# Patient Record
Sex: Male | Born: 1945 | Race: White | Hispanic: No | Marital: Married | Smoking: Never smoker
Health system: Southern US, Community
[De-identification: ages and names within clinical notes are randomized; demographics above are authoritative.]

## PROBLEM LIST (undated history)

## (undated) DIAGNOSIS — M5412 Radiculopathy, cervical region: Secondary | ICD-10-CM

## (undated) DIAGNOSIS — I80201 Phlebitis and thrombophlebitis of unspecified deep vessels of right lower extremity: Secondary | ICD-10-CM

## (undated) DIAGNOSIS — E119 Type 2 diabetes mellitus without complications: Secondary | ICD-10-CM

## (undated) DIAGNOSIS — I10 Essential (primary) hypertension: Secondary | ICD-10-CM

## (undated) DIAGNOSIS — M1990 Unspecified osteoarthritis, unspecified site: Secondary | ICD-10-CM

## (undated) DIAGNOSIS — F419 Anxiety disorder, unspecified: Secondary | ICD-10-CM

## (undated) HISTORY — PX: HERNIA REPAIR: SHX51

## (undated) HISTORY — PX: WISDOM TOOTH EXTRACTION: SHX21

## (undated) HISTORY — PX: TONSILLECTOMY: SUR1361

## (undated) HISTORY — PX: TESTICULAR EXPLORATION: SHX5145

---

## 2009-11-14 ENCOUNTER — Ambulatory Visit: Payer: Self-pay | Admitting: Internal Medicine

## 2009-11-14 DIAGNOSIS — I10 Essential (primary) hypertension: Secondary | ICD-10-CM | POA: Insufficient documentation

## 2009-11-14 DIAGNOSIS — T7840XA Allergy, unspecified, initial encounter: Secondary | ICD-10-CM | POA: Insufficient documentation

## 2009-11-17 ENCOUNTER — Encounter: Payer: Self-pay | Admitting: Internal Medicine

## 2009-11-17 LAB — CONVERTED CEMR LAB
AST: 76 units/L — ABNORMAL HIGH (ref 0–37)
Albumin: 4.4 g/dL (ref 3.5–5.2)
Alkaline Phosphatase: 57 units/L (ref 39–117)
BUN: 13 mg/dL (ref 6–23)
Basophils Absolute: 0 10*3/uL (ref 0.0–0.1)
Basophils Relative: 0.6 % (ref 0.0–3.0)
Bilirubin, Direct: 0.3 mg/dL (ref 0.0–0.3)
Creatinine, Ser: 0.8 mg/dL (ref 0.4–1.5)
GFR calc non Af Amer: 105.7 mL/min (ref >60)
Glucose, Bld: 101 mg/dL — ABNORMAL HIGH (ref 70–99)
HCT: 47.3 % (ref 39.0–52.0)
MCHC: 34.9 g/dL (ref 30.0–36.0)
MCV: 100.6 fL — ABNORMAL HIGH (ref 78.0–100.0)
Monocytes Relative: 11 % (ref 3.0–12.0)
Neutro Abs: 4.4 10*3/uL (ref 1.4–7.7)
Platelets: 171 10*3/uL (ref 150.0–400.0)
Potassium: 3.4 meq/L — ABNORMAL LOW (ref 3.5–5.1)
Total Bilirubin: 1.4 mg/dL — ABNORMAL HIGH (ref 0.3–1.2)
Total Protein: 7.9 g/dL (ref 6.0–8.3)
WBC: 6.3 10*3/uL (ref 4.5–10.5)

## 2009-11-20 ENCOUNTER — Telehealth: Payer: Self-pay | Admitting: Internal Medicine

## 2009-11-27 ENCOUNTER — Telehealth (INDEPENDENT_AMBULATORY_CARE_PROVIDER_SITE_OTHER): Payer: Self-pay | Admitting: *Deleted

## 2010-07-28 NOTE — Assessment & Plan Note (Signed)
Summary: mold exposure/workers comp case/kcw   CC:  mold exposure and side pain.  History of Present Illness: Nov 14, 2009- 65 yoM nonsmoker seen for concern of possible environmental/ mold exposure after water leak at [employer]. He has worked there 22 years, currently as VP over Public librarian, in an office area where he describes black mold around duct work, water damage, other sick employees. They were moved to a different work area and renovation begun. He reports previous good health with no prior respiratory or allergic disease. He was doing well at time of routine physical exam at end of September, 2010 and through vacation in October. In November he noted eyes burning, sensation of lump in throat with some difficulty swallowing, left upper quadrant pain. In late April he "felt funny", confused, word-searching. He has felt better since moving out of the exposed area. He still notes a little soreness in the left upper quadrant- that discomfort was never explained. Still some irritation or congestion in nose .  He denies fever, rash, swollen glands, weight loss, cough or wheeze, purulence or bleeding, sinus infections or sore throats.. He has a remote hx of gout and says he is aware of some soreness in joints, but doesn't describe hot or acutely painful joints and indicates this is really at longe term baseline. He had taken no meds for these issues this winter.   Preventive Screening-Counseling & Management  Alcohol-Tobacco     Smoking Status: never  Current Medications (verified): 1)  Amlodipine Besylate 5 Mg Tabs (Amlodipine Besylate) .... Once Daily 2)  Losartan Potassium-Hctz 100-25 Mg Tabs (Losartan Potassium-Hctz) .... Once Daily  Allergies (verified): No Known Drug Allergies  Past History:  Past Medical History: Hypertension Gout  Past Surgical History: Tonsillectomy swollen testicle repair- benign 1968  Family History: father- emphysema  deceased age 42 mother- breast Ca deceased at 37  Social History: Patient never smoked.  ETOH 3-5 beers/ day Married, no children VP at [employer] Smoking Status:  never  Review of Systems      See HPI       The patient complains of abdominal pain, difficulty swallowing, sore throat, and joint stiffness or pain.  The patient denies shortness of breath with activity, shortness of breath at rest, productive cough, non-productive cough, coughing up blood, chest pain, irregular heartbeats, acid heartburn, indigestion, loss of appetite, weight change, tooth/dental problems, headaches, nasal congestion/difficulty breathing through nose, sneezing, itching, ear ache, anxiety, depression, hand/feet swelling, rash, change in color of mucus, and fever.    Vital Signs:  Patient profile:   65 year old male Height:      68 inches Weight:      237 pounds BMI:     36.17 O2 Sat:      97 % on Room air Pulse rate:   66 / minute BP sitting:   138 / 70  (left arm) Cuff size:   large  Vitals Entered By: Denna Haggard, CMA (Nov 14, 2009 12:00 PM)  O2 Sat at Rest %:  97% O2 Flow:  Room air  Physical Exam  Additional Exam:  General: A/Ox3; pleasant and cooperative, NAD, wdwn SKIN: no rash, lesions NODES: no lymphadenopathy HEENT: [place]/AT, EOM- WNL, Conjuctivae- injected, PERRLA, TM-WNL, Nose- clear, Throat- clear and wnl, Mallampati  III. Palate obscures posterior pharyngeal wall. Throat clearing. No stridor NECK: Supple w/ fair ROM, JVD- none, normal carotid impulses w/o bruits Thyroid- normal to palpation CHEST: Clear to P&A HEART: RRR,  no m/g/r heard ABDOMEN: Soft and nl; nml bowel sounds; no organomegaly or masses noted. Nontender to palpation in LUQ ZOX:WRUE, nl pulses, no edema. Hard prominence at elbows, suggestive of tophae or calcification in the olecranon bursae without crepitus or tenderness. NEURO: Grossly intact to observation      Impression &  Recommendations:  Problem # 1:  ALLERGY, ENVIRONMENTAL (ICD-995.3)  One of a group of coworkers being evaluated for symptoms with question of relation to mold exposure, water leak in workplace. He asks specifically about his liver because of regular beer drinking, but has been in good health previously. We will check basic labs and do IgE allergy profile with fungal assay. He has had some Left upper quadrant or left lower anterior chest pain, gradually improving. We can CXR with this in mind.   Medications Added to Medication List This Visit: 1)  Amlodipine Besylate 5 Mg Tabs (Amlodipine besylate) .... Once daily 2)  Losartan Potassium-hctz 100-25 Mg Tabs (Losartan potassium-hctz) .... Once daily  Other Orders: Consultation Level IV (99244) TLB-CBC Platelet - w/Differential (85025-CBCD) TLB-BMP (Basic Metabolic Panel-BMET) (80048-METABOL) TLB-Hepatic/Liver Function Pnl (80076-HEPATIC) T-Allergy Profile Region II-[place], [place], [place], [place], [place] [phone]) T-Fungal Panel Immuno ([id number]) T-2 View CXR (71020TC)  Patient Instructions: 1)  Please schedule a follow-up appointment as needed. 2)  Lab 3)  A chest x-ray has been recommended.  Your imaging study may require preauthorization.

## 2010-07-28 NOTE — Letter (Signed)
Summary: [facility]  [address]   [place], [place] [address]   Phone: [phone]  Fax: [phone]    11/17/2009  THERRON SELLS [address] [place], [place]  [address]  Dear Mr. GRATER,  We have tried to reach you by telephone regarding your recent test results, but were unfortunately unable to speak with you.  Please contact our office at [phone] to obtain these results. Thank You.         Sincerely,   Jetty Duhamel, MD

## 2010-07-28 NOTE — Progress Notes (Signed)
Summary: fax lab results to pt  Phone Note Call from Patient   Caller: Patient Call For: young Reason for Call: Talk to Doctor Summary of Call: would like to get his blood work results faxed to him.  Fax# [phone] Initial call taken by: Eugene Gavia,  November 27, 2009 10:12 AM  Follow-up for Phone Call        Faxed labs to pt.//Juanita Follow-up by: Darletta Moll,  November 27, 2009 1:16 PM

## 2010-07-28 NOTE — Progress Notes (Signed)
Summary: results  Phone Note Call from Patient   Caller: Patient Call For: wert Summary of Call: pt called re: results. [phone] Initial call taken by: Tivis Ringer, CNA,  Nov 20, 2009 10:33 AM  Follow-up for Phone Call        pt advised per appends of cxr and lab results. Number corrected in EMR and IDX.Joe Rivera CMA  Nov 20, 2009 10:42 AM

## 2011-05-03 ENCOUNTER — Encounter (HOSPITAL_COMMUNITY): Payer: Self-pay | Admitting: Pharmacy Technician

## 2011-05-04 ENCOUNTER — Other Ambulatory Visit: Payer: Self-pay

## 2011-05-04 ENCOUNTER — Other Ambulatory Visit (HOSPITAL_COMMUNITY): Payer: Worker's Compensation

## 2011-05-04 ENCOUNTER — Ambulatory Visit (HOSPITAL_COMMUNITY)
Admission: RE | Admit: 2011-05-04 | Discharge: 2011-05-04 | Disposition: A | Discharge status: Home / Self Care | Payer: Worker's Compensation | Source: Ambulatory Visit | Attending: Orthopedic Surgery | Admitting: Orthopedic Surgery

## 2011-05-04 ENCOUNTER — Encounter (HOSPITAL_COMMUNITY)
Admission: RE | Admit: 2011-05-04 | Discharge: 2011-05-04 | Disposition: A | Discharge status: Home / Self Care | Payer: Worker's Compensation | Source: Ambulatory Visit | Attending: Orthopedic Surgery | Admitting: Orthopedic Surgery

## 2011-05-04 ENCOUNTER — Encounter (HOSPITAL_COMMUNITY): Payer: Self-pay

## 2011-05-04 DIAGNOSIS — Z01818 Encounter for other preprocedural examination: Secondary | ICD-10-CM | POA: Insufficient documentation

## 2011-05-04 DIAGNOSIS — Z01810 Encounter for preprocedural cardiovascular examination: Secondary | ICD-10-CM | POA: Insufficient documentation

## 2011-05-04 DIAGNOSIS — Z01812 Encounter for preprocedural laboratory examination: Secondary | ICD-10-CM | POA: Insufficient documentation

## 2011-05-04 HISTORY — DX: Anxiety disorder, unspecified: F41.9

## 2011-05-04 HISTORY — DX: Radiculopathy, cervical region: M54.12

## 2011-05-04 HISTORY — DX: Phlebitis and thrombophlebitis of unspecified deep vessels of right lower extremity: I80.201

## 2011-05-04 HISTORY — DX: Essential (primary) hypertension: I10

## 2011-05-04 LAB — CBC
HCT: 44.7 % (ref 39.0–52.0)
Hemoglobin: 16.4 g/dL (ref 13.0–17.0)
RDW: 12 % (ref 11.5–15.5)
WBC: 6.2 10*3/uL (ref 4.0–10.5)

## 2011-05-04 LAB — URINALYSIS, ROUTINE W REFLEX MICROSCOPIC
Glucose, UA: NEGATIVE mg/dL
Leukocytes, UA: NEGATIVE
Specific Gravity, Urine: 1.007 (ref 1.005–1.030)
pH: 7.5 (ref 5.0–8.0)

## 2011-05-04 LAB — ABO/RH: ABO/RH(D): A POS

## 2011-05-04 LAB — DIFFERENTIAL
Basophils Relative: 1 % (ref 0–1)
Eosinophils Absolute: 0.4 10*3/uL (ref 0.0–0.7)
Eosinophils Relative: 6 % — ABNORMAL HIGH (ref 0–5)
Monocytes Absolute: 0.9 10*3/uL (ref 0.1–1.0)
Monocytes Relative: 14 % — ABNORMAL HIGH (ref 3–12)
Neutrophils Relative %: 61 % (ref 43–77)

## 2011-05-04 LAB — COMPREHENSIVE METABOLIC PANEL
Albumin: 3.9 g/dL (ref 3.5–5.2)
Alkaline Phosphatase: 64 U/L (ref 39–117)
BUN: 10 mg/dL (ref 6–23)
Chloride: 102 mEq/L (ref 96–112)
Potassium: 3.5 mEq/L (ref 3.5–5.1)
Total Bilirubin: 0.7 mg/dL (ref 0.3–1.2)

## 2011-05-04 LAB — SURGICAL PCR SCREEN: Staphylococcus aureus: POSITIVE — AB

## 2011-05-04 LAB — APTT: aPTT: 29 seconds (ref 24–37)

## 2011-05-04 NOTE — Pre-Procedure Instructions (Signed)
20 Joe Rivera  05/04/2011   Your procedure is scheduled on: Thursday May 06, 2011  Report to [facility] at 530 AM.  Call this number if you have problems the morning of surgery: [phone]   Remember:   Do not eat food:After Midnight.  Do not drink clear liquids: 4 Hours before arrival.(1:30am)  Take these medicines the morning of surgery with A SIP OF WATER:allopurinol, amlodipine, hypromellose    Do not wear jewelry, make-up or nail polish.  Do not wear lotions, powders, or perfumes. You may wear deodorant.  Do not shave 48 hours prior to surgery.  Do not bring valuables to the hospital.  Contacts, dentures or bridgework may not be worn into surgery.  Leave suitcase in the car. After surgery it may be brought to your room.  For patients admitted to the hospital, checkout time is 11:00 AM the day of discharge.   Patients discharged the day of surgery will not be allowed to drive home.  Name and phone number of your driver: Yoandri Congrove  [phone]  Special Instructions: CHG Shower Use Special Wash: 1/2 bottle night before surgery and 1/2 bottle morning of surgery.   Please read over the following fact sheets that you were given: Pain Booklet, Coughing and Deep Breathing, Blood Transfusion Information, MRSA Information and Surgical Site Infection Prevention , Incentive spirometry

## 2011-05-06 ENCOUNTER — Encounter (HOSPITAL_COMMUNITY)
Admission: RE | Disposition: A | Discharge status: Home / Self Care | Payer: Self-pay | Source: Ambulatory Visit | Attending: Orthopedic Surgery

## 2011-05-06 ENCOUNTER — Encounter (HOSPITAL_COMMUNITY): Payer: Self-pay | Admitting: Vascular Surgery

## 2011-05-06 ENCOUNTER — Inpatient Hospital Stay (HOSPITAL_COMMUNITY)
Admission: RE | Admit: 2011-05-06 | Discharge: 2011-05-07 | DRG: 473 | Disposition: A | Discharge status: Home / Self Care | Payer: Worker's Compensation | Source: Ambulatory Visit | Attending: Orthopedic Surgery | Admitting: Orthopedic Surgery

## 2011-05-06 ENCOUNTER — Encounter (HOSPITAL_COMMUNITY): Payer: Self-pay | Admitting: *Deleted

## 2011-05-06 ENCOUNTER — Inpatient Hospital Stay (HOSPITAL_COMMUNITY): Payer: Worker's Compensation

## 2011-05-06 ENCOUNTER — Inpatient Hospital Stay (HOSPITAL_COMMUNITY): Payer: Worker's Compensation | Admitting: Vascular Surgery

## 2011-05-06 DIAGNOSIS — M109 Gout, unspecified: Secondary | ICD-10-CM | POA: Diagnosis present

## 2011-05-06 DIAGNOSIS — I10 Essential (primary) hypertension: Secondary | ICD-10-CM

## 2011-05-06 DIAGNOSIS — Z23 Encounter for immunization: Secondary | ICD-10-CM

## 2011-05-06 DIAGNOSIS — Z86718 Personal history of other venous thrombosis and embolism: Secondary | ICD-10-CM

## 2011-05-06 DIAGNOSIS — M5030 Other cervical disc degeneration, unspecified cervical region: Principal | ICD-10-CM | POA: Diagnosis present

## 2011-05-06 DIAGNOSIS — F411 Generalized anxiety disorder: Secondary | ICD-10-CM | POA: Diagnosis present

## 2011-05-06 HISTORY — PX: ANTERIOR CERVICAL DECOMP/DISCECTOMY FUSION: SHX1161

## 2011-05-06 SURGERY — ANTERIOR CERVICAL DECOMPRESSION/DISCECTOMY FUSION 1 LEVEL
Anesthesia: General | Laterality: Right | Wound class: Clean

## 2011-05-06 MED ORDER — PHENOL 1.4 % MT LIQD: 1.0000 | OROMUCOSAL | Status: DC | PRN

## 2011-05-06 MED ORDER — AMLODIPINE BESYLATE 10 MG PO TABS
10.0000 mg | ORAL_TABLET | Freq: Every day | ORAL | Status: DC
  Administered 2011-05-06: 10 mg via ORAL
  Filled 2011-05-06 (×2): qty 1

## 2011-05-06 MED ORDER — THROMBIN 20000 UNITS EX KIT
PACK | CUTANEOUS | Status: DC | PRN
  Administered 2011-05-06: 20000 [IU] via TOPICAL

## 2011-05-06 MED ORDER — SENNA 8.6 MG PO TABS
1.0000 | ORAL_TABLET | Freq: Two times a day (BID) | ORAL | Status: DC
  Administered 2011-05-07: 8.6 mg via ORAL
  Filled 2011-05-06 (×3): qty 1

## 2011-05-06 MED ORDER — HYDROMORPHONE HCL PF 1 MG/ML IJ SOLN: 0.2500 mg | INTRAMUSCULAR | Status: DC | PRN

## 2011-05-06 MED ORDER — MIDAZOLAM HCL 2 MG/2ML IJ SOLN: 0.5000 mg | Freq: Once | INTRAMUSCULAR | Status: DC | PRN

## 2011-05-06 MED ORDER — MORPHINE SULFATE 2 MG/ML IJ SOLN: 0.0500 mg/kg | INTRAMUSCULAR | Status: DC | PRN

## 2011-05-06 MED ORDER — LACTATED RINGERS IV SOLN: INTRAVENOUS | Status: DC

## 2011-05-06 MED ORDER — DIAZEPAM 5 MG PO TABS
5.0000 mg | ORAL_TABLET | Freq: Four times a day (QID) | ORAL | Status: DC | PRN
  Administered 2011-05-06 – 2011-05-07 (×2): 5 mg via ORAL
  Filled 2011-05-06 (×2): qty 1

## 2011-05-06 MED ORDER — SODIUM CHLORIDE 0.9 % IJ SOLN: 3.0000 mL | Freq: Two times a day (BID) | INTRAMUSCULAR | Status: DC

## 2011-05-06 MED ORDER — ALLOPURINOL 300 MG PO TABS
300.0000 mg | ORAL_TABLET | Freq: Every day | ORAL | Status: DC | PRN
  Filled 2011-05-06: qty 1

## 2011-05-06 MED ORDER — MORPHINE SULFATE 4 MG/ML IJ SOLN
2.0000 mg | INTRAMUSCULAR | Status: DC | PRN
  Administered 2011-05-06: 2 mg via INTRAVENOUS
  Filled 2011-05-06: qty 1

## 2011-05-06 MED ORDER — FENTANYL CITRATE 0.05 MG/ML IJ SOLN: 50.0000 ug | INTRAMUSCULAR | Status: DC | PRN

## 2011-05-06 MED ORDER — INFLUENZA VIRUS VACC SPLIT PF IM SUSP
0.5000 mL | INTRAMUSCULAR | Status: DC
Start: 2011-05-07 — End: 2011-05-07
  Filled 2011-05-06: qty 0.5

## 2011-05-06 MED ORDER — SODIUM CHLORIDE 0.9 % IJ SOLN
3.0000 mL | INTRAMUSCULAR | Status: DC | PRN
  Administered 2011-05-06: 3 mL via INTRAVENOUS

## 2011-05-06 MED ORDER — HYDROCHLOROTHIAZIDE 25 MG PO TABS
25.0000 mg | ORAL_TABLET | Freq: Every day | ORAL | Status: DC
  Administered 2011-05-06: 25 mg via ORAL
  Filled 2011-05-06 (×2): qty 1

## 2011-05-06 MED ORDER — HYDROMORPHONE HCL PF 1 MG/ML IJ SOLN
INTRAMUSCULAR | Status: AC
  Filled 2011-05-06: qty 1

## 2011-05-06 MED ORDER — HYPROMELLOSE 0.3 % OP SOLN: 1.0000 [drp] | OPHTHALMIC | Status: DC

## 2011-05-06 MED ORDER — PROMETHAZINE HCL 25 MG/ML IJ SOLN: 6.2500 mg | INTRAMUSCULAR | Status: DC | PRN

## 2011-05-06 MED ORDER — ALUM & MAG HYDROXIDE-SIMETH 400-400-40 MG/5ML PO SUSP
30.0000 mL | Freq: Four times a day (QID) | ORAL | Status: DC | PRN
  Filled 2011-05-06: qty 30

## 2011-05-06 MED ORDER — MUPIROCIN 2 % EX OINT
TOPICAL_OINTMENT | Freq: Two times a day (BID) | CUTANEOUS | Status: DC
  Filled 2011-05-06: qty 22

## 2011-05-06 MED ORDER — MEPERIDINE HCL 25 MG/ML IJ SOLN: 6.2500 mg | INTRAMUSCULAR | Status: DC | PRN

## 2011-05-06 MED ORDER — NEOSTIGMINE METHYLSULFATE 1 MG/ML IJ SOLN
INTRAMUSCULAR | Status: DC | PRN
  Administered 2011-05-06: 5 mg via INTRAVENOUS

## 2011-05-06 MED ORDER — ZOLPIDEM TARTRATE 5 MG PO TABS
5.0000 mg | ORAL_TABLET | Freq: Every evening | ORAL | Status: DC | PRN
  Administered 2011-05-06: 5 mg via ORAL
  Filled 2011-05-06: qty 1

## 2011-05-06 MED ORDER — LOSARTAN POTASSIUM 50 MG PO TABS
100.0000 mg | ORAL_TABLET | Freq: Every day | ORAL | Status: DC
  Administered 2011-05-06: 100 mg via ORAL
  Filled 2011-05-06 (×2): qty 2

## 2011-05-06 MED ORDER — CEFAZOLIN SODIUM 1-5 GM-% IV SOLN
INTRAVENOUS | Status: DC | PRN
  Administered 2011-05-06: 2 g via INTRAVENOUS

## 2011-05-06 MED ORDER — PROPOFOL 10 MG/ML IV EMUL
INTRAVENOUS | Status: DC | PRN
  Administered 2011-05-06: 200 mg via INTRAVENOUS

## 2011-05-06 MED ORDER — BUPIVACAINE-EPINEPHRINE 0.25% -1:200000 IJ SOLN
INTRAMUSCULAR | Status: DC | PRN
  Administered 2011-05-06: 2 mL

## 2011-05-06 MED ORDER — DOCUSATE SODIUM 100 MG PO CAPS
100.0000 mg | ORAL_CAPSULE | Freq: Two times a day (BID) | ORAL | Status: DC
  Administered 2011-05-06: 100 mg via ORAL
  Filled 2011-05-06 (×2): qty 1

## 2011-05-06 MED ORDER — POTASSIUM CHLORIDE IN NACL 20-0.9 MEQ/L-% IV SOLN
INTRAVENOUS | Status: DC
  Administered 2011-05-06: 19:00:00 via INTRAVENOUS
  Filled 2011-05-06 (×4): qty 1000

## 2011-05-06 MED ORDER — POLYVINYL ALCOHOL 1.4 % OP SOLN
1.0000 [drp] | OPHTHALMIC | Status: DC | PRN
  Filled 2011-05-06: qty 15

## 2011-05-06 MED ORDER — SODIUM CHLORIDE 0.9 % IV SOLN: 250.0000 mL | INTRAVENOUS | Status: DC

## 2011-05-06 MED ORDER — CEFAZOLIN SODIUM 1-5 GM-% IV SOLN
1.0000 g | Freq: Three times a day (TID) | INTRAVENOUS | Status: AC
  Administered 2011-05-06 (×2): 1 g via INTRAVENOUS
  Filled 2011-05-06 (×2): qty 50

## 2011-05-06 MED ORDER — GLYCOPYRROLATE 0.2 MG/ML IJ SOLN
INTRAMUSCULAR | Status: DC | PRN
  Administered 2011-05-06: .6 mg via INTRAVENOUS

## 2011-05-06 MED ORDER — ACETAMINOPHEN 325 MG PO TABS: 650.0000 mg | ORAL_TABLET | ORAL | Status: DC | PRN

## 2011-05-06 MED ORDER — MIDAZOLAM HCL 5 MG/5ML IJ SOLN
INTRAMUSCULAR | Status: DC | PRN
  Administered 2011-05-06: 2 mg via INTRAVENOUS

## 2011-05-06 MED ORDER — FENTANYL CITRATE 0.05 MG/ML IJ SOLN
INTRAMUSCULAR | Status: DC | PRN
  Administered 2011-05-06 (×4): 50 ug via INTRAVENOUS
  Administered 2011-05-06: 150 ug via INTRAVENOUS

## 2011-05-06 MED ORDER — ACETAMINOPHEN 650 MG RE SUPP: 650.0000 mg | RECTAL | Status: DC | PRN

## 2011-05-06 MED ORDER — MAGNESIUM HYDROXIDE 400 MG/5ML PO SUSP: 30.0000 mL | Freq: Two times a day (BID) | ORAL | Status: DC | PRN

## 2011-05-06 MED ORDER — LACTATED RINGERS IV SOLN
INTRAVENOUS | Status: DC | PRN
  Administered 2011-05-06 (×4): via INTRAVENOUS

## 2011-05-06 MED ORDER — OXYCODONE-ACETAMINOPHEN 5-325 MG PO TABS
1.0000 | ORAL_TABLET | ORAL | Status: DC | PRN
  Administered 2011-05-06 – 2011-05-07 (×2): 2 via ORAL
  Filled 2011-05-06 (×2): qty 2

## 2011-05-06 MED ORDER — HYDROMORPHONE HCL PF 1 MG/ML IJ SOLN
0.2500 mg | INTRAMUSCULAR | Status: DC | PRN
  Administered 2011-05-06 (×3): 0.5 mg via INTRAVENOUS

## 2011-05-06 MED ORDER — SUCCINYLCHOLINE CHLORIDE 20 MG/ML IJ SOLN
INTRAMUSCULAR | Status: DC | PRN
  Administered 2011-05-06: 120 mg via INTRAVENOUS

## 2011-05-06 MED ORDER — MIDAZOLAM HCL 2 MG/2ML IJ SOLN: 1.0000 mg | INTRAMUSCULAR | Status: DC | PRN

## 2011-05-06 MED ORDER — MENTHOL 3 MG MT LOZG
1.0000 | LOZENGE | OROMUCOSAL | Status: DC | PRN
  Filled 2011-05-06: qty 9

## 2011-05-06 MED ORDER — ONDANSETRON HCL 4 MG/2ML IJ SOLN
INTRAMUSCULAR | Status: DC | PRN
  Administered 2011-05-06: 4 mg via INTRAVENOUS

## 2011-05-06 MED ORDER — HYDROXYZINE HCL 25 MG PO TABS: 50.0000 mg | ORAL_TABLET | ORAL | Status: DC | PRN

## 2011-05-06 MED ORDER — HYDROXYZINE HCL 50 MG/ML IM SOLN: 50.0000 mg | INTRAMUSCULAR | Status: DC | PRN

## 2011-05-06 MED ORDER — ROCURONIUM BROMIDE 100 MG/10ML IV SOLN
INTRAVENOUS | Status: DC | PRN
  Administered 2011-05-06: 30 mg via INTRAVENOUS
  Administered 2011-05-06 (×2): 20 mg via INTRAVENOUS

## 2011-05-06 MED ORDER — LOSARTAN POTASSIUM-HCTZ 100-25 MG PO TABS: 1.0000 | ORAL_TABLET | Freq: Every day | ORAL | Status: DC

## 2011-05-06 SURGICAL SUPPLY — 73 items
3.0X3.8MM NEURO DRILL, LESS AGGRESSIVE ×2 IMPLANT
5mm cervical 0 deg endoskeleton  small (Cage) ×2 IMPLANT
5mm cervical 0 deg endoskeleton medium (Cage) ×4 IMPLANT
BENZOIN TINCTURE PRP APPL 2/3 (GAUZE/BANDAGES/DRESSINGS) ×2 IMPLANT
BIT DRILL NEURO 2X3.1 SFT TUCH (MISCELLANEOUS) ×1 IMPLANT
BLADE LONG MED 31X9 (MISCELLANEOUS) IMPLANT
BLADE SURG 15 STRL LF DISP TIS (BLADE) ×1 IMPLANT
BLADE SURG 15 STRL SS (BLADE) ×1
BLADE SURG ROTATE 9660 (MISCELLANEOUS) ×2 IMPLANT
BUR NEURO DRILL SOFT 3.0X3.8M (BURR) ×2 IMPLANT
CARTRIDGE OIL MAESTRO DRILL (MISCELLANEOUS) ×1 IMPLANT
CLOTH BEACON ORANGE TIMEOUT ST (SAFETY) ×2 IMPLANT
COLLAR CERV LO CONTOUR FIRM DE (SOFTGOODS) IMPLANT
CORDS BIPOLAR (ELECTRODE) ×2 IMPLANT
COVER SURGICAL LIGHT HANDLE (MISCELLANEOUS) ×2 IMPLANT
CRADLE DONUT ADULT HEAD (MISCELLANEOUS) ×2 IMPLANT
DIFFUSER DRILL AIR PNEUMATIC (MISCELLANEOUS) ×2 IMPLANT
DISTRACTION PIN 14MM (INSTRUMENTS) IMPLANT
DRAIN JACKSON RD 7FR 3/32 (WOUND CARE) ×2 IMPLANT
DRAPE C-ARM 42X72 X-RAY (DRAPES) ×2 IMPLANT
DRAPE POUCH INSTRU U-SHP 10X18 (DRAPES) ×2 IMPLANT
DRAPE SURG 17X23 STRL (DRAPES) ×6 IMPLANT
DRILL NEURO 2X3.1 SOFT TOUCH (MISCELLANEOUS) ×2
DURAPREP 6ML APPLICATOR 50/CS (WOUND CARE) ×2 IMPLANT
ELECT COATED BLADE 2.86 ST (ELECTRODE) ×2 IMPLANT
ELECT REM PT RETURN 9FT ADLT (ELECTROSURGICAL) ×2
ELECTRODE REM PT RTRN 9FT ADLT (ELECTROSURGICAL) ×1 IMPLANT
EVACUATOR SILICONE 100CC (DRAIN) ×2 IMPLANT
GAUZE SPONGE 4X4 16PLY XRAY LF (GAUZE/BANDAGES/DRESSINGS) ×2 IMPLANT
GLOVE BIO SURGEON STRL SZ8 (GLOVE) ×4 IMPLANT
GLOVE BIOGEL PI IND STRL 8 (GLOVE) ×1 IMPLANT
GLOVE BIOGEL PI INDICATOR 8 (GLOVE) ×1
GOWN PREVENTION PLUS XLARGE (GOWN DISPOSABLE) ×4 IMPLANT
GOWN STRL NON-REIN LRG LVL3 (GOWN DISPOSABLE) ×2 IMPLANT
IV CATH 14GX2 1/4 (CATHETERS) ×2 IMPLANT
KIT BASIN OR (CUSTOM PROCEDURE TRAY) ×2 IMPLANT
KIT ROOM TURNOVER OR (KITS) ×2 IMPLANT
MANIFOLD NEPTUNE II (INSTRUMENTS) ×2 IMPLANT
NEEDLE 27GAX1X1/2 (NEEDLE) ×2 IMPLANT
NEEDLE SPNL 20GX3.5 QUINCKE YW (NEEDLE) ×2 IMPLANT
NS IRRIG 1000ML POUR BTL (IV SOLUTION) ×2 IMPLANT
OIL CARTRIDGE MAESTRO DRILL (MISCELLANEOUS) ×2
PACK ORTHO CERVICAL (CUSTOM PROCEDURE TRAY) ×2 IMPLANT
PAD ARMBOARD 7.5X6 YLW CONV (MISCELLANEOUS) ×2 IMPLANT
PATTIES SURGICAL .5 X.5 (GAUZE/BANDAGES/DRESSINGS) IMPLANT
PATTIES SURGICAL .5 X1 (DISPOSABLE) IMPLANT
PIN RETAINER PRODISC 14 MM (PIN) ×4 IMPLANT
PLATE VECTRA 45MM (Plate) ×2 IMPLANT
PUTTY BONE DBX 5CC MIX (Putty) ×2 IMPLANT
RETAINER SCREWS ×4 IMPLANT
SCREW 4.0X16MM (Screw) ×16 IMPLANT
SPONGE GAUZE 4X4 12PLY (GAUZE/BANDAGES/DRESSINGS) ×2 IMPLANT
SPONGE GAUZE 4X4 STERILE 39 (GAUZE/BANDAGES/DRESSINGS) ×2 IMPLANT
SPONGE INTESTINAL PEANUT (DISPOSABLE) ×6 IMPLANT
SPONGE SURGIFOAM ABS GEL 100 (HEMOSTASIS) IMPLANT
STERI STRIP BROWN 1/4X3 R155 (GAUZE/BANDAGES/DRESSINGS) ×2 IMPLANT
STRIP CLOSURE SKIN 1/2X4 (GAUZE/BANDAGES/DRESSINGS) ×2 IMPLANT
SURGIFLO TRUKIT (HEMOSTASIS) IMPLANT
SUT MNCRL AB 4-0 PS2 18 (SUTURE) IMPLANT
SUT SILK 4 0 (SUTURE)
SUT SILK 4-0 18XBRD TIE 12 (SUTURE) IMPLANT
SUT VIC AB 1 CT1 27 (SUTURE) ×1
SUT VIC AB 1 CT1 27XBRD ANBCTR (SUTURE) ×1 IMPLANT
SUT VIC AB 2-0 CT2 18 VCP726D (SUTURE) ×2 IMPLANT
SYR BULB IRRIGATION 50ML (SYRINGE) ×2 IMPLANT
SYR CONTROL 10ML LL (SYRINGE) ×2 IMPLANT
TAPE CLOTH 4X10 WHT NS (GAUZE/BANDAGES/DRESSINGS) ×2 IMPLANT
TAPE CLOTH SURG 6X10 WHT LF (GAUZE/BANDAGES/DRESSINGS) ×2 IMPLANT
TAPE UMBILICAL COTTON 1/8X30 (MISCELLANEOUS) ×4 IMPLANT
TOWEL OR 17X24 6PK STRL BLUE (TOWEL DISPOSABLE) ×2 IMPLANT
TOWEL OR 17X26 10 PK STRL BLUE (TOWEL DISPOSABLE) ×2 IMPLANT
WATER STERILE IRR 1000ML POUR (IV SOLUTION) ×2 IMPLANT
YANKAUER SUCT BULB TIP NO VENT (SUCTIONS) ×2 IMPLANT

## 2011-05-06 NOTE — Anesthesia Procedure Notes (Addendum)
Performed by: Cecilie Kicks    Procedure Name: Intubation Date/Time: 05/06/2011 7:45 AM Performed by: Cecilie Kicks Pre-anesthesia Checklist: Timeout performed, Patient identified, Emergency Drugs available, Suction available and Patient being monitored Patient Re-evaluated:Patient Re-evaluated prior to inductionOxygen Delivery Method: Simple face mask Intubation Type: IV induction Ventilation: Mask ventilation without difficulty Laryngoscope Size: 3 and Miller Grade View: Grade II Tube type: Oral Tube size: 7.5 mm Airway Equipment and Method: oral airway Placement Confirmation: ETT inserted through vocal cords under direct vision,  positive ETCO2 and breath sounds checked- equal and bilateral Secured at: 21 cm Tube secured with: Tape Dental Injury: Teeth and Oropharynx as per pre-operative assessment     Performed by: Cecilie Kicks

## 2011-05-06 NOTE — Anesthesia Postprocedure Evaluation (Signed)
  Anesthesia Post-op Note  Patient: Joe Rivera  Procedure(s) Performed:  ANTERIOR CERVICAL DECOMPRESSION/DISCECTOMY FUSION 1 LEVEL - ANTERIOR CERVICAL DECOMPRESSION AN FUSION C4-7 WITH INSTRUMENTATION RIGHT  Patient Location: PACU  Anesthesia Type: General  Level of Consciousness: awake  Airway and Oxygen Therapy: Patient Spontanous Breathing  Post-op Pain: mild  Post-op Assessment: Post-op Vital signs reviewed  Post-op Vital Signs: stable  Complications: No apparent anesthesia complications

## 2011-05-06 NOTE — Transfer of Care (Signed)
Immediate Anesthesia Transfer of Care Note  Patient: Joe Rivera  Procedure(s) Performed:  ANTERIOR CERVICAL DECOMPRESSION/DISCECTOMY FUSION 1 LEVEL - ANTERIOR CERVICAL DECOMPRESSION AN FUSION C4-7 WITH INSTRUMENTATION RIGHT  Patient Location: PACU  Anesthesia Type: General  Level of Consciousness: awake, alert  and oriented  Airway & Oxygen Therapy: Patient connected to face mask oxygen  Post-op Assessment: Report given to PACU RN and Post -op Vital signs reviewed and stable  Post vital signs: Reviewed and stable  Complications: No apparent anesthesia complications

## 2011-05-06 NOTE — Op Note (Signed)
NAMEALLIE, Joe Rivera              ACCOUNT NO.:  [id number]  MEDICAL RECORD NO.:  [id number]  LOCATION:  [facility]                         FACILITY:  [facility]  PHYSICIAN:  Estill Bamberg, MD      DATE OF BIRTH:  [birth year]  DATE OF PROCEDURE:  05/06/2011 DATE OF DISCHARGE:                              OPERATIVE REPORT   PREOPERATIVE DIAGNOSIS: 1. Right-sided cervical radiculopathy. 2. Severe degenerative disk disease, C4-5, C5-6, C6-7.  POSTOPERATIVE DIAGNOSES: 1. Right-sided cervical radiculopathy. 2. Severe degenerative disk disease, C4-5, C5-6, C6-7.  PROCEDURE: 1. Anterior cervical decompression and fusion C4-5, C5-6, C6-7. 2. Placement of anterior instrumentation, C4-C7. 3. Placement of Titan interbody cage x3 (5 mm parallel Titan cages     x3). 4. Use of local autograft. 5. Use of morselized allograft. 6. Intraoperative use of fluoroscopy.  SURGEON:  Estill Bamberg, MD  ASSISTANT:  Skip Mayer, PA-C  ANESTHESIA:  General endotracheal anesthesia.  COMPLICATIONS:  None.  DISPOSITION:  Stable.  ESTIMATED BLOOD LOSS:  Minimal.  INDICATIONS FOR PROCEDURE:  Briefly, Joe Rivera is an extremely pleasant 65 year old male who was injured in July and went on to have severe pain and weakness in the right arm.  The patient was evaluated by me on April 05, 2011.  I did review an MRI, which was notable for severe degenerative disk disease with severe foraminal stenosis at the C4-5, C5-6, and C6-7 levels.  I did feel that the patient's symptomatology did correlate to the MRI findings.  Of note, the patient did have physical therapy as well as anti-inflammatory medications and did continue to have severe pain in addition to weakness.  Given his failure of conservative care and ongoing and severe pain, we did have a discussion regarding going forward with a three-level anterior cervical decompression and fusion spanning C4-C7 for the reasons noted above. The patient fully  understood the risks and limitations of the procedure as outlined in my preoperative note.  OPERATIVE DETAILS:  On May 06, 2011, the patient was brought to Surgery and general endotracheal anesthesia was administered.  2 g of Ancef were given for antibiotic prophylaxis.  SCDs were placed.  Time- out procedure was performed.  I then brought in lateral fluoroscopy and did locate the approximate area of the C5-6 intervertebral level.  The neck was then prepped and draped in the usual sterile fashion.  I then made a transverse incision from the midline to the medial border of the sternocleidomastoid muscle.  The plane between the sternocleidomastoid muscle laterally and strap muscles medially was readily identified and explored.  The anterior cervical spine was readily noted.  I then placed a 20-gauge spinal needle into the C4-5 intervertebral space and a lateral fluoroscopic view did confirm this to be the appropriate level. I then subperiosteally exposed the vertebral bodies of C4, C5, C6, and C7 out to the uncovertebral joints.  Of note, there were extensive osteophytes noted mainly involving the C4-5 and C5-6 levels into lesser extent the C6-7 level.  These osteophytes were removed and autograft was placed in the back table for later use.  I then placed a Shadow Line retractor centered over the C6-7 intervertebral space.  I then used a  15- blade knife and went forward with the preliminary diskectomy.  Of note, there were severe and significant osteophytes, and it was extremely difficult to gain access into the intervertebral space.  I then placed distraction pins in the C6 and C7 vertebral bodies.  I then meticulously used a series of curettes and Kerrison punches to perform a thorough diskectomy to the posterior longitudinal ligament.  I did again access through the posterior longitudinal ligament and #1 Kerrison was used to remove posterior osteophytes and a thorough right-sided  neural foraminal decompression was performed.  I was able to easily sweep a nerve hook out the C6-7 intervertebral space, which did confirm adequate decompression of the exiting C7 nerve.  I then gently prepared the C6 and C7 endplates using a bur in addition to her rasp.  I then went forward placing a series of trials.  I did feel that a medium 5-mm interbody implant would be the most appropriate fit.  Allograft obtained from musculoskeletal transplant foundation was selected and packed into the interbody device.  The osteophytes that were removed using autograft were also packed into the interbody device.  The interbody device was tamped into position in the usual fashion and I did note an excellent press fit.  The C7 Caspar pin was removed and the bone wax was placed. I then turned my attention towards the C5-6 interspace.  Again, there was extensive osteophytes identified and there was severe height loss noted at the C5-6 level.  I did, however, meticulously go forward with a diskectomy to the posterior longitudinal ligament.  I then gained access to the posterior longitudinal ligament, and we again went forward with a thorough neural foraminal decompression on the right side at the C5-6 level.  Of note, this level was extremely tight and the exiting C6 nerve was noted to be extremely stenotic with extensive and abundant bone spurs noted in the neuroforaminal area.  I did however safely and adequately remove the disk osteophyte complex and I was able to easily sweep the nerve hook out the neural foramen on the right side, which did confirm adequate decompression of the nerve.  I then again prepared the endplates and tamped a 5-mm parallel Titan medium cage into position. The cage was packed with autograft and allograft as noted previously. Again noted an excellent press fit.  I then turned my attention towards the C4-5 interspace.  A diskectomy was performed in the manner  described previously.  A thorough foraminal decompression was performed on the right side.  I again was able to easily sweep a nerve hook out the neural foramen on the right side at the termination of the procedure. Again, the endplates were prepared.  I then selected a small medium parallel Titan cage and this was packed with allograft and autograft and tamped into position in the manner described previously.  I then chose an appropriately sized Synthes Vector plate.  This was placed over the anterior cervical spine.  A total of 16 mm self-drilling, self-tapping, variable angle screws were placed.  Two screws were placed into each of the vertebral bodies of C4, C5, C6, and C7.  I did note excellent purchase of each of the screws.  AP and lateral fluoroscopy views were used while applying the plate to confirm adequate positioning of the plate.  I did ensure that the plate was as low as possible so as not to accelerate adjacent segment disease at the level above.  I was very happy with the final  construct and the thorough decompression described above.  At this point, the wound was copiously irrigated.  The deep subcutaneous layer was closed using 2-0 Vicryl and the skin was closed using 3-0 Monocryl.  All instrument counts were correct at the termination of the procedure.  Of note, Skip Mayer was my assistant throughout the procedure and aided in essential retraction and suctioning needed throughout the surgery.     Estill Bamberg, MD     MD/MEDQ  D:  05/06/2011  T:  05/06/2011  Job:  [id number]  cc:   Crist Fat, MD

## 2011-05-06 NOTE — H&P (Signed)
PREOPERATIVE H&P  Chief Complaint: right arm pain  HPI: Joe Rivera is a 65 y.o. male who presents for right arm pain x 4 months  Past Medical History  Diagnosis Date  . Hypertension   . Gout     history of, takes allopurinol as needed  . Cervical radicular pain     has pain in right arm  . Anxiety     does not take medication for  . Deep vein thrombophlebitis of right leg     1991 or 1992   Past Surgical History  Procedure Date  . Tonsillectomy     age 75 years old  . Testicular exploration    History   Social History  . Marital Status: Married    Spouse Name: N/A    Number of Children: N/A  . Years of Education: N/A   Social History Main Topics  . Smoking status: Never Smoker   . Smokeless tobacco: None  . Alcohol Use: 4.2 oz/week    7 Cans of beer per week  . Drug Use: No  . Sexually Active: Yes   Other Topics Concern  . None   Social History Narrative  . None   History reviewed. No pertinent family history. No Known Allergies Prior to Admission medications   Medication Sig Start Date End Date Taking? Authorizing Provider  amLODipine (NORVASC) 10 MG tablet Take 10 mg by mouth daily.     Yes Historical Provider, MD  Hypromellose (GENTEAL) 0.3 % SOLN Place 1 drop into both eyes every morning.     Yes Historical Provider, MD  losartan-hydrochlorothiazide (HYZAAR) 100-25 MG per tablet Take 1 tablet by mouth daily.     Yes Historical Provider, MD  allopurinol (ZYLOPRIM) 300 MG tablet Take 300 mg by mouth daily as needed. For gout      Historical Provider, MD  naproxen (NAPROSYN) 500 MG tablet Take 500 mg by mouth 2 (two) times daily as needed. For pain     Historical Provider, MD     All other systems have been reviewed and were otherwise negative with the exception of those mentioned in the HPI and as above.  Physical Exam: Filed Vitals:   05/06/11 0600  BP: 152/103  Pulse: 98  Temp: 98.1 F (36.7 C)  Resp: 18    General: Alert, no acute  distress Cardiovascular: No pedal edema Respiratory: No cyanosis, no use of accessory musculature GI: No organomegaly, abdomen is soft and non-tender Skin: No lesions in the area of chief complaint Neurologic: Sensation intact distally Psychiatric: Patient is competent for consent with normal mood and affect Lymphatic: No axillary or cervical lymphadenopathy  MUSCULOSKELETAL: + spurlings sign on right, weakness on right  Assessment/Plan: CERVICAL RADICULITIS Plan for Procedure(s): ANTERIOR CERVICAL DECOMPRESSION/DISCECTOMY FUSION 3 LEVELs (c4-c7)   Joe Rivera 05/06/2011 7:13 AM

## 2011-05-06 NOTE — Preoperative (Signed)
Beta Blockers   Reason not to administer Beta Blockers:Not Applicable 

## 2011-05-06 NOTE — Anesthesia Postprocedure Evaluation (Signed)
  Anesthesia Post-op Note  Patient: Joe Rivera  Procedure(s) Performed:  ANTERIOR CERVICAL DECOMPRESSION/DISCECTOMY FUSION 1 LEVEL - ANTERIOR CERVICAL DECOMPRESSION AN FUSION C4-7 WITH INSTRUMENTATION RIGHT  Patient Location: PACU  Anesthesia Type: General  Level of Consciousness: awake  Airway and Oxygen Therapy: Patient Spontanous Breathing  Post-op Pain: mild  Post-op Assessment: Post-op Vital signs reviewed  Post-op Vital Signs: stable  Complications: No apparent anesthesia complications 

## 2011-05-06 NOTE — Anesthesia Preprocedure Evaluation (Signed)
Anesthesia Evaluation  Patient identified by MRN, date of birth, ID band Patient awake    Reviewed: Allergy & Precautions, H&P , NPO status , Patient's Chart, lab work & pertinent test results  Airway       Dental   Pulmonary          Cardiovascular hypertension,     Neuro/Psych    GI/Hepatic   Endo/Other    Renal/GU      Musculoskeletal   Abdominal   Peds  Hematology   Anesthesia Other Findings   Reproductive/Obstetrics                           Anesthesia Physical Anesthesia Plan  ASA: III  Anesthesia Plan: General   Post-op Pain Management:    Induction: Intravenous  Airway Management Planned: Oral ETT  Additional Equipment:   Intra-op Plan:   Post-operative Plan: Extubation in OR  Informed Consent: I have reviewed the patients History and Physical, chart, labs and discussed the procedure including the risks, benefits and alternatives for the proposed anesthesia with the patient or authorized representative who has indicated his/her understanding and acceptance.   Dental advisory given  Plan Discussed with:   Anesthesia Plan Comments:         Anesthesia Quick Evaluation

## 2011-05-07 NOTE — Discharge Summary (Signed)
NAMELUISALBERTO, BEEGLE              ACCOUNT NO.:  [id number]  MEDICAL RECORD NO.:  [id number]  LOCATION:  3528                         FACILITY:  [facility]  PHYSICIAN:  Estill Bamberg, MD      DATE OF BIRTH:  [birth year]  DATE OF ADMISSION:  05/06/2011 DATE OF DISCHARGE:  05/07/2011                              DISCHARGE SUMMARY   ADMISSION DIAGNOSES: 1. Severe right-sided cervical radiculopathy. 2. Severe degenerative disk disease at C4-5, C5-6, and C6-7.  DISCHARGE DIAGNOSES: 1. Severe right-sided cervical radiculopathy. 2. Severe degenerative disk disease at C4-5, C5-6, and C6-7.  ADMISSION HISTORY:  Briefly, Mr. Obara is an extremely pleasant 65- year-old male who was injured in July, went on to severe pain and weakness in the right arm.  The patient was evaluated by me.  MRI was reviewed and was notable for severe right-sided neural foraminal stenosis at the lower 2 levels of the cervical spine.  The patient failed extensive conservative care and we did have a discussion regarding going forward with a three-level anterior cervical decompression and fusion.  HOSPITAL COURSE:  On May 06, 2011, the patient was brought to surgery and underwent a 3-level ACDF as noted above.  The patient tolerated the procedure well and was transferred to recovery in stable condition.  An Aspen collar was placed postoperatively.  The patient was evaluated in the morning of postop day #1 by me.  The patient was noted to be very comfortable.  His right arm pain was resolved and his discomfort was minimal.  He was neurovascularly intact and his dressing was noted to be clean, dry, and intact.  The patient was uneventfully discharged home.  DISCHARGE INSTRUCTIONS:  The patient will take Percocet for pain and Valium for spasms.  He will wear his Aspen cervical collar at all times and he is given a [place] collar to be used while showering.  The patient will follow up in my office in  approximately 2 weeks.  He understands to avoid lifting anything over 10 pounds.     Estill Bamberg, MD     MD/MEDQ  D:  05/07/2011  T:  05/07/2011  Job:  [id number]

## 2011-05-07 NOTE — Progress Notes (Signed)
Utilization review completed. Tariya Morrissette, RN, BSN. 05/07/11 

## 2011-05-07 NOTE — Progress Notes (Signed)
Ortho  PT reports resolved right arm pain, minimal neck pain  AVSS NVI Dressing cdi, collar positioned well  POD #1 after C4-C7acdf doing well  Collar at all times [place] for showering D/c home, follow-up 2 weeks

## 2011-05-07 NOTE — Progress Notes (Signed)
Physical Therapy Evaluation Patient Details Name: Joe Rivera MRN: [id number] DOB: 1946/04/19 Today's Date: 05/07/2011  Problem List:  Patient Active Problem List  Diagnoses  . HYPERTENSION  . ALLERGY, ENVIRONMENTAL    Past Medical History:  Past Medical History  Diagnosis Date  . Hypertension   . Gout     history of, takes allopurinol as needed  . Cervical radicular pain     has pain in right arm  . Anxiety     does not take medication for  . Deep vein thrombophlebitis of right leg     1991 or 1992   Past Surgical History:  Past Surgical History  Procedure Date  . Tonsillectomy     age 18 years old  . Testicular exploration     PT Assessment/Plan/Recommendation PT Assessment Clinical Impression Statement: Pt s/p ACDF with no impairments in LE strength and mobility. Pt is cleared to d/c home today and does not need any follow-up therapy. Reinforced the use of the aspen collar. PT Recommendation/Assessment: Patent does not need any further PT services No Skilled PT: All education completed;Patient is independent with all acitivity/mobility PT Goals     PT Evaluation Precautions/Restrictions  Precautions Required Braces or Orthoses: Yes Cervical Brace: Hard collar Restrictions Weight Bearing Restrictions: No Prior Functioning  Home Living Lives With: Spouse Type of Home: House Home Layout: Two level Alternate Level Stairs-Rails: Left Alternate Level Stairs-Number of Steps: 16 Home Access: Stairs to enter Entrance Stairs-Rails: None Entrance Stairs-Number of Steps: 3 Home Adaptive Equipment: None Prior Function Level of Independence: Independent with homemaking with ambulation Driving: Yes Vocation: Full time employment Cognition Cognition Arousal/Alertness: Awake/alert Orientation Level: Oriented X4 Sensation/Coordination Sensation Light Touch: Appears Intact Coordination Gross Motor Movements are Fluid and Coordinated: Yes Extremity Assessment  RLE Assessment RLE Assessment: Within Functional Limits LLE Assessment LLE Assessment: Within Functional Limits Mobility (including Balance) Bed Mobility Bed Mobility: Yes Supine to Sit: 7: Independent Transfers Transfers: Yes Sit to Stand: 7: Independent Stand to Sit: 7: Independent Ambulation/Gait Ambulation/Gait: Yes Ambulation/Gait Assistance: 7: Independent Ambulation Distance (Feet): 175 Feet Assistive device: None Gait Pattern: Within Functional Limits Gait velocity: good Stairs: No  Posture/Postural Control Posture/Postural Control: No significant limitations Balance Balance Assessed: Yes Static Standing Balance Static Standing - Balance Support: No upper extremity supported Static Standing - Level of Assistance: 7: Independent Exercise    End of Session PT - End of Session Equipment Utilized During Treatment: Gait belt Activity Tolerance: Patient tolerated treatment well Patient left: in bed Nurse Communication: Mobility status for transfers;Mobility status for ambulation (Pt is I with mobility and cleared to amb. by self) General Behavior During Session: [facility] for tasks performed Cognition: [facility] for tasks performed  Greggory Stallion 05/07/2011, 9:28 AM

## 2011-05-18 ENCOUNTER — Encounter (HOSPITAL_COMMUNITY): Payer: Self-pay | Admitting: Orthopedic Surgery

## 2016-08-05 DIAGNOSIS — I10 Essential (primary) hypertension: Secondary | ICD-10-CM | POA: Diagnosis not present

## 2016-08-05 DIAGNOSIS — K4090 Unilateral inguinal hernia, without obstruction or gangrene, not specified as recurrent: Secondary | ICD-10-CM | POA: Diagnosis not present

## 2016-08-05 DIAGNOSIS — R1031 Right lower quadrant pain: Secondary | ICD-10-CM | POA: Diagnosis not present

## 2016-08-16 DIAGNOSIS — Z01818 Encounter for other preprocedural examination: Secondary | ICD-10-CM | POA: Diagnosis not present

## 2016-08-23 DIAGNOSIS — D176 Benign lipomatous neoplasm of spermatic cord: Secondary | ICD-10-CM | POA: Diagnosis not present

## 2016-08-23 DIAGNOSIS — K4030 Unilateral inguinal hernia, with obstruction, without gangrene, not specified as recurrent: Secondary | ICD-10-CM | POA: Diagnosis not present

## 2016-08-23 DIAGNOSIS — I10 Essential (primary) hypertension: Secondary | ICD-10-CM | POA: Diagnosis not present

## 2016-08-23 DIAGNOSIS — Z79899 Other long term (current) drug therapy: Secondary | ICD-10-CM | POA: Diagnosis not present

## 2016-08-23 DIAGNOSIS — R1031 Right lower quadrant pain: Secondary | ICD-10-CM | POA: Diagnosis not present

## 2016-08-23 DIAGNOSIS — K4090 Unilateral inguinal hernia, without obstruction or gangrene, not specified as recurrent: Secondary | ICD-10-CM | POA: Diagnosis not present

## 2016-08-23 DIAGNOSIS — M1990 Unspecified osteoarthritis, unspecified site: Secondary | ICD-10-CM | POA: Diagnosis not present

## 2016-09-02 DIAGNOSIS — R1310 Dysphagia, unspecified: Secondary | ICD-10-CM | POA: Diagnosis not present

## 2016-09-02 DIAGNOSIS — K222 Esophageal obstruction: Secondary | ICD-10-CM | POA: Diagnosis not present

## 2016-10-13 DIAGNOSIS — K4091 Unilateral inguinal hernia, without obstruction or gangrene, recurrent: Secondary | ICD-10-CM | POA: Diagnosis not present

## 2016-10-18 DIAGNOSIS — M109 Gout, unspecified: Secondary | ICD-10-CM | POA: Diagnosis not present

## 2016-10-18 DIAGNOSIS — I10 Essential (primary) hypertension: Secondary | ICD-10-CM | POA: Diagnosis not present

## 2016-10-18 DIAGNOSIS — J45909 Unspecified asthma, uncomplicated: Secondary | ICD-10-CM | POA: Diagnosis not present

## 2016-10-18 DIAGNOSIS — R399 Unspecified symptoms and signs involving the genitourinary system: Secondary | ICD-10-CM | POA: Diagnosis not present

## 2016-10-18 DIAGNOSIS — G8918 Other acute postprocedural pain: Secondary | ICD-10-CM | POA: Diagnosis not present

## 2016-10-18 DIAGNOSIS — K4031 Unilateral inguinal hernia, with obstruction, without gangrene, recurrent: Secondary | ICD-10-CM | POA: Diagnosis not present

## 2016-10-18 DIAGNOSIS — Z79899 Other long term (current) drug therapy: Secondary | ICD-10-CM | POA: Diagnosis not present

## 2016-10-18 DIAGNOSIS — E669 Obesity, unspecified: Secondary | ICD-10-CM | POA: Diagnosis not present

## 2016-10-18 DIAGNOSIS — K4091 Unilateral inguinal hernia, without obstruction or gangrene, recurrent: Secondary | ICD-10-CM | POA: Diagnosis not present

## 2016-10-18 DIAGNOSIS — K219 Gastro-esophageal reflux disease without esophagitis: Secondary | ICD-10-CM | POA: Diagnosis not present

## 2016-10-18 DIAGNOSIS — K4090 Unilateral inguinal hernia, without obstruction or gangrene, not specified as recurrent: Secondary | ICD-10-CM | POA: Diagnosis not present

## 2016-10-18 DIAGNOSIS — Z6836 Body mass index (BMI) 36.0-36.9, adult: Secondary | ICD-10-CM | POA: Diagnosis not present

## 2016-10-18 DIAGNOSIS — Z981 Arthrodesis status: Secondary | ICD-10-CM | POA: Diagnosis not present

## 2019-01-26 ENCOUNTER — Other Ambulatory Visit: Payer: Self-pay

## 2019-09-13 DIAGNOSIS — T783XXA Angioneurotic edema, initial encounter: Secondary | ICD-10-CM | POA: Diagnosis not present

## 2019-09-13 DIAGNOSIS — R221 Localized swelling, mass and lump, neck: Secondary | ICD-10-CM | POA: Diagnosis not present

## 2021-04-09 NOTE — Patient Instructions (Addendum)
DUE TO COVID-19 ONLY ONE VISITOR IS ALLOWED TO COME WITH YOU AND STAY IN THE WAITING ROOM ONLY DURING PRE OP AND PROCEDURE.   **NO VISITORS ARE ALLOWED IN THE SHORT STAY AREA OR RECOVERY ROOM!!**  IF YOU WILL BE ADMITTED INTO THE HOSPITAL YOU ARE ALLOWED ONLY TWO SUPPORT PEOPLE DURING VISITATION HOURS ONLY (7 AM -8PM)    Up to two visitors ages 105+ are allowed at one time in a patient's room.  The visitors may rotate out with other people throughout the day.  Additionally, up to two children between the ages of 67 and 66 are allowed and do not count toward the number of allowed visitors.  Children within this age range must be accompanied by an adult visitor.  One adult visitor may remain with the patient overnight and must be in the room by 8 PM.  COVID SWAB TESTING MUST BE COMPLETED ON:  Friday, 04-17-21,  Between the hours of 8 and 3  **MUST PRESENT COMPLETED FORM AT TESTING SITE**    [facility] (backside of the building)  You are not required to quarantine, however you are required to wear a well-fitted mask when you are out and around people not in your household.  Hand Hygiene often Do NOT share personal items Notify your provider if you are in close contact with someone who has COVID or you develop fever 100.4 or greater, new onset of sneezing, cough, sore throat, shortness of breath or body aches.        Your procedure is scheduled on:  Tuesday, 04-21-21   Report to [facility] Main  Entrance    Report to admitting at 6:00 AM   Call this number if you have problems the morning of surgery [phone]   Do not eat food :After Midnight.   May have liquids until 5:30 AM day of surgery  CLEAR LIQUID DIET  Foods Allowed                                                                     Foods Excluded  Water, Black Coffee (no milk/no creamer) and tea, regular and decaf                              liquids that you cannot  Plain Jell-O in any  flavor  (No red)                         see through such as: Fruit ices (not with fruit pulp)                                 milk, soups, orange juice  Iced Popsicles (No red)                                    All solid food                             Apple juices  Sports drinks like Gatorade (No red) Lightly seasoned clear broth or consume(fat free) Sugar     Complete one G2 drink the morning of surgery at 5:30 AM the day of surgery.      The day of surgery:  Drink ONE (1) Pre-Surgery Clear  G2 by am the morning of surgery. Drink in one sitting. Do not sip.  This drink was given to you during your hospital  pre-op appointment visit. Nothing else to drink after completing the G2.          If you have questions, please contact your surgeon's office.     Oral Hygiene is also important to reduce your risk of infection.                                    Remember - BRUSH YOUR TEETH THE MORNING OF SURGERY WITH YOUR REGULAR TOOTHPASTE   Do NOT smoke after Midnight   Take these medicines the morning of surgery with A SIP OF WATER:  Allopurinol, Amlodipine, Clonidine, Metoprolol  How to Manage Your Diabetes Before and After Surgery  Why is it important to control my blood sugar before and after surgery? Improving blood sugar levels before and after surgery helps healing and can limit problems. A way of improving blood sugar control is eating a healthy diet by:  Eating less sugar and carbohydrates  Increasing activity/exercise  Talking with your doctor about reaching your blood sugar goals High blood sugars (greater than 180 mg/dL) can raise your risk of infections and slow your recovery, so you will need to focus on controlling your diabetes during the weeks before surgery. Make sure that the doctor who takes care of your diabetes knows about your planned surgery including the date and location.  How do I manage my blood sugar before surgery? Check your blood sugar at least 4  times a day, starting 2 days before surgery, to make sure that the level is not too high or low. Check your blood sugar the morning of your surgery when you wake up and every 2 hours until you get to the Short Stay unit. If your blood sugar is less than 70 mg/dL, you will need to treat for low blood sugar: Do not take insulin. Treat a low blood sugar (less than 70 mg/dL) with  cup of clear juice (cranberry or apple), 4 glucose tablets, OR glucose gel. Recheck blood sugar in 15 minutes after treatment (to make sure it is greater than 70 mg/dL). If your blood sugar is not greater than 70 mg/dL on recheck, call [phone] for further instructions. Report your blood sugar to the short stay nurse when you get to Short Stay.  If you are admitted to the hospital after surgery: Your blood sugar will be checked by the staff and you will probably be given insulin after surgery (instead of oral diabetes medicines) to make sure you have good blood sugar levels. The goal for blood sugar control after surgery is 80-180 mg/dL.   WHAT DO I DO ABOUT MY DIABETES MEDICATION?  Do not take oral diabetes medicines (pills) the morning of surgery.  THE DAY BEFORE SURGERY:  Take Metformin as prescribed.       THE MORNING OF SURGERY:  Do not take Metformin, take   units of         insulin.   Reviewed and Endorsed by [facility] Patient Education Committee, August  2015                 Stop all vitamins and herbal supplements a week before surgery             You may not have any metal on your body including  jewelry, and body piercing             Do not wear lotions, powders, cologne, or deodorant              Men may shave face and neck.  Do not bring valuables to the hospital. [place].   Contacts, dentures or bridgework may not be worn into surgery.   Bring small overnight bag day of surgery.  Please read over the following fact sheets you were given: IF YOU HAVE  QUESTIONS ABOUT YOUR PRE OP INSTRUCTIONS PLEASE CALL [facility] - Preparing for Surgery Before surgery, you can play an important role.  Because skin is not sterile, your skin needs to be as free of germs as possible.  You can reduce the number of germs on your skin by washing with CHG (chlorahexidine gluconate) soap before surgery.  CHG is an antiseptic cleaner which kills germs and bonds with the skin to continue killing germs even after washing. Please DO NOT use if you have an allergy to CHG or antibacterial soaps.  If your skin becomes reddened/irritated stop using the CHG and inform your nurse when you arrive at Short Stay. Do not shave (including legs and underarms) for at least 48 hours prior to the first CHG shower.  You may shave your face/neck.  Please follow these instructions carefully:  1.  Shower with CHG Soap the night before surgery and the  morning of surgery.  2.  If you choose to wash your hair, wash your hair first as usual with your normal  shampoo.  3.  After you shampoo, rinse your hair and body thoroughly to remove the shampoo.                             4.  Use CHG as you would any other liquid soap.  You can apply chg directly to the skin and wash.  Gently with a scrungie or clean washcloth.  5.  Apply the CHG Soap to your body ONLY FROM THE NECK DOWN.   Do   not use on face/ open                           Wound or open sores. Avoid contact with eyes, ears mouth and   genitals (private parts).                       Wash face,  Genitals (private parts) with your normal soap.             6.  Wash thoroughly, paying special attention to the area where your    surgery  will be performed.  7.  Thoroughly rinse your body with warm water from the neck down.  8.  DO NOT shower/wash with your normal soap after using and rinsing off the CHG Soap.                9.  Pat yourself dry with a clean towel.  10.  Wear clean pajamas.            11.   Place clean sheets on your bed the night of your first shower and do not  sleep with pets. Day of Surgery : Do not apply any lotions/deodorants the morning of surgery.  Please wear clean clothes to the hospital/surgery center.  FAILURE TO FOLLOW THESE INSTRUCTIONS MAY RESULT IN THE CANCELLATION OF YOUR SURGERY  PATIENT SIGNATURE_________________________________  NURSE SIGNATURE__________________________________  ________________________________________________________________________   Adam Phenix  An incentive spirometer is a tool that can help keep your lungs clear and active. This tool measures how well you are filling your lungs with each breath. Taking long deep breaths may help reverse or decrease the chance of developing breathing (pulmonary) problems (especially infection) following: A long period of time when you are unable to move or be active. BEFORE THE PROCEDURE  If the spirometer includes an indicator to show your best effort, your nurse or respiratory therapist will set it to a desired goal. If possible, sit up straight or lean slightly forward. Try not to slouch. Hold the incentive spirometer in an upright position. INSTRUCTIONS FOR USE  Sit on the edge of your bed if possible, or sit up as far as you can in bed or on a chair. Hold the incentive spirometer in an upright position. Breathe out normally. Place the mouthpiece in your mouth and seal your lips tightly around it. Breathe in slowly and as deeply as possible, raising the piston or the ball toward the top of the column. Hold your breath for 3-5 seconds or for as long as possible. Allow the piston or ball to fall to the bottom of the column. Remove the mouthpiece from your mouth and breathe out normally. Rest for a few seconds and repeat Steps 1 through 7 at least 10 times every 1-2 hours when you are awake. Take your time and take a few normal breaths between deep breaths. The spirometer may include an  indicator to show your best effort. Use the indicator as a goal to work toward during each repetition. After each set of 10 deep breaths, practice coughing to be sure your lungs are clear. If you have an incision (the cut made at the time of surgery), support your incision when coughing by placing a pillow or rolled up towels firmly against it. Once you are able to get out of bed, walk around indoors and cough well. You may stop using the incentive spirometer when instructed by your caregiver.  RISKS AND COMPLICATIONS Take your time so you do not get dizzy or light-headed. If you are in pain, you may need to take or ask for pain medication before doing incentive spirometry. It is harder to take a deep breath if you are having pain. AFTER USE Rest and breathe slowly and easily. It can be helpful to keep track of a log of your progress. Your caregiver can provide you with a simple table to help with this. If you are using the spirometer at home, follow these instructions: [place] IF:  You are having difficultly using the spirometer. You have trouble using the spirometer as often as instructed. Your pain medication is not giving enough relief while using the spirometer. You develop fever of 100.5 F (38.1 C) or higher. SEEK IMMEDIATE MEDICAL CARE IF:  You cough up bloody sputum that had not been present before. You develop fever of 102 F (38.9 C) or greater. You develop worsening pain at  or near the incision site. MAKE SURE YOU:  Understand these instructions. Will watch your condition. Will get help right away if you are not doing well or get worse. Document Released: 10/25/2006 Document Revised: 09/06/2011 Document Reviewed: 12/26/2006 ExitCare Patient Information 2014 ExitCare, [place].   ________________________________________________________________________  WHAT IS A BLOOD TRANSFUSION? Blood Transfusion Information  A transfusion is the replacement of blood or some of its  parts. Blood is made up of multiple cells which provide different functions. Red blood cells carry oxygen and are used for blood loss replacement. White blood cells fight against infection. Platelets control bleeding. Plasma helps clot blood. Other blood products are available for specialized needs, such as hemophilia or other clotting disorders. BEFORE THE TRANSFUSION  Who gives blood for transfusions?  Healthy volunteers who are fully evaluated to make sure their blood is safe. This is blood bank blood. Transfusion therapy is the safest it has ever been in the practice of medicine. Before blood is taken from a donor, a complete history is taken to make sure that person has no history of diseases nor engages in risky social behavior (examples are intravenous drug use or sexual activity with multiple partners). The donor's travel history is screened to minimize risk of transmitting infections, such as malaria. The donated blood is tested for signs of infectious diseases, such as HIV and hepatitis. The blood is then tested to be sure it is compatible with you in order to minimize the chance of a transfusion reaction. If you or a relative donates blood, this is often done in anticipation of surgery and is not appropriate for emergency situations. It takes many days to process the donated blood. RISKS AND COMPLICATIONS Although transfusion therapy is very safe and saves many lives, the main dangers of transfusion include:  Getting an infectious disease. Developing a transfusion reaction. This is an allergic reaction to something in the blood you were given. Every precaution is taken to prevent this. The decision to have a blood transfusion has been considered carefully by your caregiver before blood is given. Blood is not given unless the benefits outweigh the risks. AFTER THE TRANSFUSION Right after receiving a blood transfusion, you will usually feel much better and more energetic. This is especially  true if your red blood cells have gotten low (anemic). The transfusion raises the level of the red blood cells which carry oxygen, and this usually causes an energy increase. The nurse administering the transfusion will monitor you carefully for complications. HOME CARE INSTRUCTIONS  No special instructions are needed after a transfusion. You may find your energy is better. Speak with your caregiver about any limitations on activity for underlying diseases you may have. SEEK MEDICAL CARE IF:  Your condition is not improving after your transfusion. You develop redness or irritation at the intravenous (IV) site. SEEK IMMEDIATE MEDICAL CARE IF:  Any of the following symptoms occur over the next 12 hours: Shaking chills. You have a temperature by mouth above 102 F (38.9 C), not controlled by medicine. Chest, back, or muscle pain. People around you feel you are not acting correctly or are confused. Shortness of breath or difficulty breathing. Dizziness and fainting. You get a rash or develop hives. You have a decrease in urine output. Your urine turns a dark color or changes to pink, red, or brown. Any of the following symptoms occur over the next 10 days: You have a temperature by mouth above 102 F (38.9 C), not controlled by medicine. Shortness of breath. Weakness  after normal activity. The white part of the eye turns yellow (jaundice). You have a decrease in the amount of urine or are urinating less often. Your urine turns a dark color or changes to pink, red, or brown. Document Released: 06/11/2000 Document Revised: 09/06/2011 Document Reviewed: 01/29/2008 [facility] Patient Information 2014 [place], [place].  _______________________________________________________________________

## 2021-04-10 ENCOUNTER — Other Ambulatory Visit: Payer: Self-pay

## 2021-04-10 ENCOUNTER — Encounter (HOSPITAL_COMMUNITY)
Admission: RE | Admit: 2021-04-10 | Discharge: 2021-04-10 | Disposition: A | Discharge status: Home / Self Care | Payer: No Typology Code available for payment source | Source: Ambulatory Visit | Attending: Orthopedic Surgery | Admitting: Orthopedic Surgery

## 2021-04-10 ENCOUNTER — Encounter (HOSPITAL_COMMUNITY): Payer: Self-pay

## 2021-04-10 DIAGNOSIS — Z86718 Personal history of other venous thrombosis and embolism: Secondary | ICD-10-CM | POA: Diagnosis not present

## 2021-04-10 DIAGNOSIS — E119 Type 2 diabetes mellitus without complications: Secondary | ICD-10-CM | POA: Diagnosis not present

## 2021-04-10 DIAGNOSIS — M1711 Unilateral primary osteoarthritis, right knee: Secondary | ICD-10-CM | POA: Diagnosis not present

## 2021-04-10 DIAGNOSIS — I10 Essential (primary) hypertension: Secondary | ICD-10-CM | POA: Diagnosis not present

## 2021-04-10 DIAGNOSIS — Z01818 Encounter for other preprocedural examination: Secondary | ICD-10-CM | POA: Insufficient documentation

## 2021-04-10 DIAGNOSIS — Z79899 Other long term (current) drug therapy: Secondary | ICD-10-CM | POA: Diagnosis not present

## 2021-04-10 DIAGNOSIS — Z791 Long term (current) use of non-steroidal anti-inflammatories (NSAID): Secondary | ICD-10-CM | POA: Insufficient documentation

## 2021-04-10 DIAGNOSIS — Z7984 Long term (current) use of oral hypoglycemic drugs: Secondary | ICD-10-CM | POA: Insufficient documentation

## 2021-04-10 HISTORY — DX: Unspecified osteoarthritis, unspecified site: M19.90

## 2021-04-10 HISTORY — DX: Type 2 diabetes mellitus without complications: E11.9

## 2021-04-10 LAB — CBC
HCT: 42.3 % (ref 39.0–52.0)
Hemoglobin: 15.4 g/dL (ref 13.0–17.0)
MCH: 35.1 pg — ABNORMAL HIGH (ref 26.0–34.0)
MCHC: 36.4 g/dL — ABNORMAL HIGH (ref 30.0–36.0)
MCV: 96.4 fL (ref 80.0–100.0)
Platelets: 168 10*3/uL (ref 150–400)
RBC: 4.39 MIL/uL (ref 4.22–5.81)
RDW: 11.9 % (ref 11.5–15.5)
WBC: 7.5 10*3/uL (ref 4.0–10.5)
nRBC: 0 % (ref 0.0–0.2)

## 2021-04-10 LAB — GLUCOSE, CAPILLARY: Glucose-Capillary: 202 mg/dL — ABNORMAL HIGH (ref 70–99)

## 2021-04-10 LAB — COMPREHENSIVE METABOLIC PANEL
ALT: 38 U/L (ref 0–44)
AST: 32 U/L (ref 15–41)
Albumin: 3.9 g/dL (ref 3.5–5.0)
Alkaline Phosphatase: 66 U/L (ref 38–126)
Anion gap: 10 (ref 5–15)
BUN: 9 mg/dL (ref 8–23)
CO2: 30 mmol/L (ref 22–32)
Calcium: 8.9 mg/dL (ref 8.9–10.3)
Chloride: 93 mmol/L — ABNORMAL LOW (ref 98–111)
Creatinine, Ser: 0.69 mg/dL (ref 0.61–1.24)
GFR, Estimated: >60 mL/min (ref >60)
Glucose, Bld: 181 mg/dL — ABNORMAL HIGH (ref 70–99)
Potassium: 3.4 mmol/L — ABNORMAL LOW (ref 3.5–5.1)
Sodium: 133 mmol/L — ABNORMAL LOW (ref 135–145)
Total Bilirubin: 1.1 mg/dL (ref 0.3–1.2)
Total Protein: 8 g/dL (ref 6.5–8.1)

## 2021-04-10 LAB — SURGICAL PCR SCREEN
MRSA, PCR: NEGATIVE
Staphylococcus aureus: POSITIVE — AB

## 2021-04-10 LAB — TYPE AND SCREEN
ABO/RH(D): A POS
Antibody Screen: NEGATIVE

## 2021-04-10 LAB — HEMOGLOBIN A1C
Hgb A1c MFr Bld: 6.5 % — ABNORMAL HIGH (ref 4.8–5.6)
Mean Plasma Glucose: 139.85 mg/dL

## 2021-04-10 NOTE — Progress Notes (Signed)
   04/10/21 1030  OBSTRUCTIVE SLEEP APNEA  Have you ever been diagnosed with sleep apnea through a sleep study? No  Do you snore loudly (loud enough to be heard through closed doors)?  0  Do you often feel tired, fatigued, or sleepy during the daytime (such as falling asleep during driving or talking to someone)? 0  Has anyone observed you stop breathing during your sleep? 0  Do you have, or are you being treated for high blood pressure? 1  BMI more than 35 kg/m2? 1  Age > 50 (1-yes) 1  Neck circumference greater than:Male 16 inches or larger, Male 17inches or larger? 1  Male Gender (Yes=1) 1  Obstructive Sleep Apnea Score 5  Score 5 or greater  Results sent to PCP

## 2021-04-10 NOTE — Progress Notes (Signed)
PCR results sent to Dr. Olin to review.   

## 2021-04-10 NOTE — Progress Notes (Addendum)
COVID swab appointment: 04-17-21  COVID Vaccine Completed:  Yes x2 Date COVID Vaccine completed: Has received booster: COVID vaccine manufacturer: [employer]   Date of COVID positive in last 90 days:  N/A  PCP - Caren Marjory Lies at [place] - N/A  Chest x-ray - N/A EKG - 04-10-21 Epic Stress Test - N/A ECHO - N/A Cardiac Cath - N/A Pacemaker/ICD device last checked: Spinal Cord Stimulator:  Sleep Study - N/A CPAP -   Fasting Blood Sugar - 120 to 200 Checks Blood Sugar - once a week   Blood Thinner Instructions:N/A Aspirin Instructions: Last Dose:  Activity level:  Can go up a flight of stairs and perform activities of daily living without stopping and without symptoms of chest pain or shortness of breath.   Able to exercise without symptoms  Anesthesia review: BP 165/96 at PAT appointment.  Patient denies headache, chest pain, SOB.     Hx of HTN, DM and Stop Bang 5.  Patient denies shortness of breath, fever, cough and chest pain at PAT appointment   Patient verbalized understanding of instructions that were given to them at the PAT appointment. Patient was also instructed that they will need to review over the PAT instructions again at home before surgery.

## 2021-04-14 NOTE — Progress Notes (Signed)
Anesthesia Chart Review   Case: [id number] Date/Time: 04/21/21 0825   Procedure: TOTAL KNEE ARTHROPLASTY (Right: Knee)   Anesthesia type: Spinal   Pre-op diagnosis: Right knee osteoarthritis   Location: [facility] ROOM 10 / [facility]   Surgeons: Paralee Cancel, MD       DISCUSSION:75 y.o. never smoker with h/o HTN, DM II (A1C 6.5), DVT, right knee OA scheduled for above procedure 04/21/2021 with Dr. Paralee Cancel.   Clearance received from PCP which states pt is low risk for planned procedure.   Anticipate pt can proceed with planned procedure barring acute status change.   VS: BP (!) 165/96   Pulse 66   Temp 36.8 C (Oral)   Resp 18   Ht 5' 10.5" (1.791 m)   Wt 116.6 kg   SpO2 97%   BMI 36.35 kg/m   PROVIDERS: Clinic, [place]: Labs reviewed: Acceptable for surgery. (all labs ordered are listed, but only abnormal results are displayed)  Labs Reviewed  SURGICAL PCR SCREEN - Abnormal; Notable for the following components:      Result Value   Staphylococcus aureus POSITIVE (*)    All other components within normal limits  CBC - Abnormal; Notable for the following components:   MCH 35.1 (*)    MCHC 36.4 (*)    All other components within normal limits  COMPREHENSIVE METABOLIC PANEL - Abnormal; Notable for the following components:   Sodium 133 (*)    Potassium 3.4 (*)    Chloride 93 (*)    Glucose, Bld 181 (*)    All other components within normal limits  HEMOGLOBIN A1C - Abnormal; Notable for the following components:   Hgb A1c MFr Bld 6.5 (*)    All other components within normal limits  GLUCOSE, CAPILLARY - Abnormal; Notable for the following components:   Glucose-Capillary 202 (*)    All other components within normal limits  TYPE AND SCREEN     IMAGES:   EKG: 04/10/2021 Rate 67 bpm  NSR  CV:  Past Medical History:  Diagnosis Date   Anxiety    does not take medication for   Arthritis    Cervical radicular pain    has pain in right arm   Deep  vein thrombophlebitis of right leg ([place])    1991 or 1992   Diabetes mellitus without complication ([place])    Gout    history of, takes allopurinol as needed   Hypertension     Past Surgical History:  Procedure Laterality Date   ANTERIOR CERVICAL DECOMP/DISCECTOMY FUSION  05/06/2011   Procedure: ANTERIOR CERVICAL DECOMPRESSION/DISCECTOMY FUSION 1 LEVEL;  Surgeon: Lawrence Santiago [facility];  Location: [place];  Service: Orthopedics;  Laterality: Right;  ANTERIOR CERVICAL DECOMPRESSION AN FUSION C4-7 WITH INSTRUMENTATION RIGHT   HERNIA REPAIR     TESTICULAR EXPLORATION     TONSILLECTOMY     age 29 years old   80 TOOTH EXTRACTION      MEDICATIONS:  allopurinol (ZYLOPRIM) 300 MG tablet   amLODipine (NORVASC) 5 MG tablet   Artificial Tear Ointment (DRY EYES OP)   atorvastatin (LIPITOR) 20 MG tablet   cloNIDine (CATAPRES) 0.1 MG tablet   diphenhydrAMINE (BENADRYL) 50 MG tablet   ibuprofen (ADVIL) 800 MG tablet   indapamide (LOZOL) 2.5 MG tablet   metFORMIN (GLUCOPHAGE) 500 MG tablet   metoprolol tartrate (LOPRESSOR) 50 MG tablet   No current facility-administered medications for this encounter.   Konrad Felix Ward, PA-C [facility] Pre-Surgical Testing [phone]  [phone]        

## 2021-04-17 ENCOUNTER — Other Ambulatory Visit: Payer: Self-pay | Admitting: Orthopedic Surgery

## 2021-04-17 LAB — SARS CORONAVIRUS 2 (TAT 6-24 HRS): SARS Coronavirus 2: NEGATIVE

## 2021-04-20 NOTE — H&P (Signed)
TOTAL KNEE ADMISSION H&P  Patient is being admitted for right total knee arthroplasty.  Subjective:  Chief Complaint:right knee pain.  HPI: Joe Rivera, 75 y.o. male, has a history of pain and functional disability in the right knee due to arthritis and has failed non-surgical conservative treatments for greater than 12 weeks to includeNSAID's and/or analgesics, corticosteriod injections, viscosupplementation injections, and activity modification.  Onset of symptoms was gradual, starting 2 years ago with gradually worsening course since that time. The patient noted no past surgery on the right knee(s).  Patient currently rates pain in the right knee(s) at 7 out of 10 with activity. Patient has worsening of pain with activity and weight bearing, pain that interferes with activities of daily living, and pain with passive range of motion.  Patient has evidence of joint space narrowing by imaging studies. There is no active infection.  Patient Active Problem List   Diagnosis Date Noted   HYPERTENSION 11/14/2009   ALLERGY, ENVIRONMENTAL 11/14/2009   Past Medical History:  Diagnosis Date   Anxiety    does not take medication for   Arthritis    Cervical radicular pain    has pain in right arm   Deep vein thrombophlebitis of right leg ([place])    1991 or 1992   Diabetes mellitus without complication ([place])    Gout    history of, takes allopurinol as needed   Hypertension     Past Surgical History:  Procedure Laterality Date   ANTERIOR CERVICAL DECOMP/DISCECTOMY FUSION  05/06/2011   Procedure: ANTERIOR CERVICAL DECOMPRESSION/DISCECTOMY FUSION 1 LEVEL;  Surgeon: Lawrence Santiago [facility];  Location: [place];  Service: Orthopedics;  Laterality: Right;  ANTERIOR CERVICAL DECOMPRESSION AN FUSION C4-7 WITH INSTRUMENTATION RIGHT   HERNIA REPAIR     TESTICULAR EXPLORATION     TONSILLECTOMY     age 27 years old   WISDOM TOOTH EXTRACTION      No current facility-administered medications for this  encounter.   Current Outpatient Medications  Medication Sig Dispense Refill Last Dose   allopurinol (ZYLOPRIM) 300 MG tablet Take 300 mg by mouth daily as needed. For gout        amLODipine (NORVASC) 5 MG tablet Take 5 mg by mouth daily.      Artificial Tear Ointment (DRY EYES OP) Place 1 drop into both eyes daily.      atorvastatin (LIPITOR) 20 MG tablet Take 20 mg by mouth at bedtime.      cloNIDine (CATAPRES) 0.1 MG tablet Take 0.1 mg by mouth 2 (two) times daily.      diphenhydrAMINE (BENADRYL) 50 MG tablet Take 50 mg by mouth at bedtime.      ibuprofen (ADVIL) 800 MG tablet Take 800 mg by mouth every 8 (eight) hours as needed for mild pain or moderate pain.      indapamide (LOZOL) 2.5 MG tablet Take 2.5 mg by mouth daily.      metFORMIN (GLUCOPHAGE) 500 MG tablet Take 1,000 mg by mouth 2 (two) times daily with a meal.      metoprolol tartrate (LOPRESSOR) 50 MG tablet Take 50 mg by mouth 2 (two) times daily.      Allergies  Allergen Reactions   Lisinopril Swelling    Tongue    Social History   Tobacco Use   Smoking status: Never   Smokeless tobacco: Never  Substance Use Topics   Alcohol use: Yes    Alcohol/week: 7.0 standard drinks    Types: 7 Cans of beer per  week    Comment: Case of beer a week    No family history on file.   Review of Systems  Constitutional:  Negative for chills and fever.  Respiratory:  Negative for cough and shortness of breath.   Cardiovascular:  Negative for chest pain.  Gastrointestinal:  Negative for nausea and vomiting.  Musculoskeletal:  Positive for arthralgias.    Objective:  Physical Exam Well nourished and well developed. General: Alert and oriented x3, cooperative and pleasant, no acute distress. Head: normocephalic, atraumatic, neck supple. Eyes: EOMI.  Musculoskeletal: Bilateral knee exams: Bilateral genu varum noted statically and dynamically Slight more flexion contracture on the right knee than left Flexion of 110  degrees with tightness and mild crepitation Tenderness over the medial and anterior aspect of both of his knees Stable and intact medial lateral collateral ligaments Neurovascularly intact distally without lower extremity edema or erythema  Calves soft and nontender. Motor function intact in LE. Strength 5/5 LE bilaterally. Neuro: Distal pulses 2+. Sensation to light touch intact in LE.   Vital signs in last 24 hours:    Labs:   Estimated body mass index is 36.35 kg/m as calculated from the following:   Height as of 04/10/21: 5' 10.5" (1.791 m).   Weight as of 04/10/21: 116.6 kg.   Imaging Review Plain radiographs demonstrate severe degenerative joint disease of the right knee(s). The overall alignment isneutral. The bone quality appears to be adequate for age and reported activity level.      Assessment/Plan:  End stage arthritis, right knee   The patient history, physical examination, clinical judgment of the provider and imaging studies are consistent with end stage degenerative joint disease of the right knee(s) and total knee arthroplasty is deemed medically necessary. The treatment options including medical management, injection therapy arthroscopy and arthroplasty were discussed at length. The risks and benefits of total knee arthroplasty were presented and reviewed. The risks due to aseptic loosening, infection, stiffness, patella tracking problems, thromboembolic complications and other imponderables were discussed. The patient acknowledged the explanation, agreed to proceed with the plan and consent was signed. Patient is being admitted for inpatient treatment for surgery, pain control, PT, OT, prophylactic antibiotics, VTE prophylaxis, progressive ambulation and ADL's and discharge planning. The patient is planning to be discharged  home.   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

## 2021-04-20 NOTE — Anesthesia Preprocedure Evaluation (Addendum)
Anesthesia Evaluation  Patient identified by MRN, date of birth, ID band Patient awake    Reviewed: Allergy & Precautions, NPO status , Patient's Chart, lab work & pertinent test results  Airway Mallampati: II  TM Distance: >3 FB Neck ROM: Full    Dental  (+) Poor Dentition, Missing   Pulmonary sleep apnea ,    Pulmonary exam normal breath sounds clear to auscultation       Cardiovascular hypertension, Pt. on home beta blockers and Pt. on medications Normal cardiovascular exam Rhythm:Regular Rate:Normal     Neuro/Psych Anxiety Cervical radiculopathy    GI/Hepatic negative GI ROS, Neg liver ROS,   Endo/Other  diabetes, Type 2, Oral Hypoglycemic Agents  Renal/GU negative Renal ROS  negative genitourinary   Musculoskeletal  (+) Arthritis ,   Abdominal   Peds negative pediatric ROS (+)  Hematology negative hematology ROS (+)   Anesthesia Other Findings   Reproductive/Obstetrics negative OB ROS                           Anesthesia Physical Anesthesia Plan  ASA: 3  Anesthesia Plan: Regional and Spinal   Post-op Pain Management:  Regional for Post-op pain   Induction: Intravenous  PONV Risk Score and Plan: Treatment may vary due to age or medical condition, Ondansetron, Propofol infusion and Midazolam  Airway Management Planned: Natural Airway and Simple Face Mask  Additional Equipment: None  Intra-op Plan:   Post-operative Plan:   Informed Consent: I have reviewed the patients History and Physical, chart, labs and discussed the procedure including the risks, benefits and alternatives for the proposed anesthesia with the patient or authorized representative who has indicated his/her understanding and acceptance.     Dental advisory given  Plan Discussed with: CRNA and Anesthesiologist  Anesthesia Plan Comments: (Adductor canal block. Spinal. GA/LMA as backup. Norton Blizzard, MD   )       Anesthesia Quick Evaluation

## 2021-04-21 ENCOUNTER — Encounter (HOSPITAL_COMMUNITY): Payer: Self-pay | Admitting: Orthopedic Surgery

## 2021-04-21 ENCOUNTER — Encounter (HOSPITAL_COMMUNITY)
Admission: RE | Disposition: A | Discharge status: Home / Self Care | Payer: Self-pay | Source: Home / Self Care | Attending: Orthopedic Surgery

## 2021-04-21 ENCOUNTER — Ambulatory Visit (HOSPITAL_COMMUNITY): Payer: No Typology Code available for payment source | Admitting: Anesthesiology

## 2021-04-21 ENCOUNTER — Ambulatory Visit (HOSPITAL_COMMUNITY): Payer: No Typology Code available for payment source | Admitting: Physician Assistant

## 2021-04-21 ENCOUNTER — Other Ambulatory Visit: Payer: Self-pay

## 2021-04-21 ENCOUNTER — Observation Stay (HOSPITAL_COMMUNITY)
Admission: RE | Admit: 2021-04-21 | Discharge: 2021-04-22 | Disposition: A | Discharge status: Home / Self Care | Payer: No Typology Code available for payment source | Attending: Orthopedic Surgery | Admitting: Orthopedic Surgery

## 2021-04-21 DIAGNOSIS — Z96651 Presence of right artificial knee joint: Secondary | ICD-10-CM

## 2021-04-21 DIAGNOSIS — Z7984 Long term (current) use of oral hypoglycemic drugs: Secondary | ICD-10-CM | POA: Diagnosis not present

## 2021-04-21 DIAGNOSIS — Z79899 Other long term (current) drug therapy: Secondary | ICD-10-CM | POA: Insufficient documentation

## 2021-04-21 DIAGNOSIS — M1711 Unilateral primary osteoarthritis, right knee: Secondary | ICD-10-CM | POA: Diagnosis present

## 2021-04-21 DIAGNOSIS — E119 Type 2 diabetes mellitus without complications: Secondary | ICD-10-CM | POA: Insufficient documentation

## 2021-04-21 DIAGNOSIS — I10 Essential (primary) hypertension: Secondary | ICD-10-CM | POA: Diagnosis not present

## 2021-04-21 HISTORY — PX: TOTAL KNEE ARTHROPLASTY: SHX125

## 2021-04-21 LAB — GLUCOSE, CAPILLARY
Glucose-Capillary: 158 mg/dL — ABNORMAL HIGH (ref 70–99)
Glucose-Capillary: 149 mg/dL — ABNORMAL HIGH (ref 70–99)
Glucose-Capillary: 161 mg/dL — ABNORMAL HIGH (ref 70–99)
Glucose-Capillary: 359 mg/dL — ABNORMAL HIGH (ref 70–99)

## 2021-04-21 SURGERY — ARTHROPLASTY, KNEE, TOTAL
Anesthesia: Regional | Site: Knee | Laterality: Right

## 2021-04-21 MED ORDER — SODIUM CHLORIDE (PF) 0.9 % IJ SOLN
INTRAMUSCULAR | Status: DC | PRN
  Administered 2021-04-21: 30 mL

## 2021-04-21 MED ORDER — POLYETHYLENE GLYCOL 3350 17 G PO PACK: 17.0000 g | PACK | Freq: Every day | ORAL | Status: DC | PRN

## 2021-04-21 MED ORDER — METOPROLOL TARTRATE 50 MG PO TABS
50.0000 mg | ORAL_TABLET | Freq: Two times a day (BID) | ORAL | Status: DC
  Administered 2021-04-21 – 2021-04-22 (×2): 50 mg via ORAL
  Filled 2021-04-21 (×2): qty 1

## 2021-04-21 MED ORDER — DOCUSATE SODIUM 100 MG PO CAPS
100.0000 mg | ORAL_CAPSULE | Freq: Two times a day (BID) | ORAL | Status: DC
  Administered 2021-04-21 – 2021-04-22 (×3): 100 mg via ORAL
  Filled 2021-04-21 (×3): qty 1

## 2021-04-21 MED ORDER — STERILE WATER FOR IRRIGATION IR SOLN
Status: DC | PRN
  Administered 2021-04-21: 2000 mL

## 2021-04-21 MED ORDER — DEXAMETHASONE SODIUM PHOSPHATE 10 MG/ML IJ SOLN
8.0000 mg | Freq: Once | INTRAMUSCULAR | Status: AC
  Administered 2021-04-21: 8 mg via INTRAVENOUS

## 2021-04-21 MED ORDER — SODIUM CHLORIDE (PF) 0.9 % IJ SOLN
INTRAMUSCULAR | Status: AC
  Filled 2021-04-21: qty 30

## 2021-04-21 MED ORDER — ONDANSETRON HCL 4 MG PO TABS: 4.0000 mg | ORAL_TABLET | Freq: Four times a day (QID) | ORAL | Status: DC | PRN

## 2021-04-21 MED ORDER — POVIDONE-IODINE 10 % EX SWAB: 2.0000 "application " | Freq: Once | CUTANEOUS | Status: DC

## 2021-04-21 MED ORDER — TRANEXAMIC ACID-NACL 1000-0.7 MG/100ML-% IV SOLN
1000.0000 mg | INTRAVENOUS | Status: AC
  Administered 2021-04-21: 1000 mg via INTRAVENOUS
  Filled 2021-04-21: qty 100

## 2021-04-21 MED ORDER — EPHEDRINE 5 MG/ML INJ
INTRAVENOUS | Status: AC
  Filled 2021-04-21: qty 5

## 2021-04-21 MED ORDER — DROPERIDOL 2.5 MG/ML IJ SOLN: 0.6250 mg | Freq: Once | INTRAMUSCULAR | Status: DC | PRN

## 2021-04-21 MED ORDER — OXYCODONE HCL 5 MG PO TABS
5.0000 mg | ORAL_TABLET | ORAL | Status: DC | PRN
  Administered 2021-04-21 – 2021-04-22 (×3): 10 mg via ORAL
  Filled 2021-04-21 (×2): qty 2

## 2021-04-21 MED ORDER — DIPHENHYDRAMINE HCL 25 MG PO CAPS
50.0000 mg | ORAL_CAPSULE | Freq: Every day | ORAL | Status: DC
  Administered 2021-04-21: 50 mg via ORAL
  Filled 2021-04-21: qty 2

## 2021-04-21 MED ORDER — CHLORHEXIDINE GLUCONATE 0.12 % MT SOLN
15.0000 mL | Freq: Once | OROMUCOSAL | Status: AC
  Administered 2021-04-21: 15 mL via OROMUCOSAL

## 2021-04-21 MED ORDER — MIDAZOLAM HCL 2 MG/2ML IJ SOLN
1.0000 mg | INTRAMUSCULAR | Status: DC
  Administered 2021-04-21: 2 mg via INTRAVENOUS
  Filled 2021-04-21: qty 2

## 2021-04-21 MED ORDER — DIPHENHYDRAMINE HCL 12.5 MG/5ML PO ELIX
12.5000 mg | ORAL_SOLUTION | ORAL | Status: DC | PRN
  Administered 2021-04-22: 25 mg via ORAL
  Filled 2021-04-21: qty 10

## 2021-04-21 MED ORDER — LACTATED RINGERS IV SOLN: INTRAVENOUS | Status: DC

## 2021-04-21 MED ORDER — METHOCARBAMOL 500 MG PO TABS
500.0000 mg | ORAL_TABLET | Freq: Four times a day (QID) | ORAL | Status: DC | PRN
  Administered 2021-04-21 – 2021-04-22 (×3): 500 mg via ORAL
  Filled 2021-04-21 (×3): qty 1

## 2021-04-21 MED ORDER — CEFAZOLIN SODIUM-DEXTROSE 2-4 GM/100ML-% IV SOLN
2.0000 g | INTRAVENOUS | Status: AC
  Administered 2021-04-21: 2 g via INTRAVENOUS
  Filled 2021-04-21: qty 100

## 2021-04-21 MED ORDER — BUPIVACAINE HCL (PF) 0.5 % IJ SOLN
INTRAMUSCULAR | Status: DC | PRN
  Administered 2021-04-21: 20 mL

## 2021-04-21 MED ORDER — DEXAMETHASONE SODIUM PHOSPHATE 10 MG/ML IJ SOLN
10.0000 mg | Freq: Once | INTRAMUSCULAR | Status: AC
  Administered 2021-04-22: 10 mg via INTRAVENOUS
  Filled 2021-04-21: qty 1

## 2021-04-21 MED ORDER — FENTANYL CITRATE PF 50 MCG/ML IJ SOSY: 25.0000 ug | PREFILLED_SYRINGE | INTRAMUSCULAR | Status: DC | PRN

## 2021-04-21 MED ORDER — FENTANYL CITRATE PF 50 MCG/ML IJ SOSY
50.0000 ug | PREFILLED_SYRINGE | INTRAMUSCULAR | Status: DC
  Administered 2021-04-21: 50 ug via INTRAVENOUS
  Filled 2021-04-21: qty 2

## 2021-04-21 MED ORDER — ONDANSETRON HCL 4 MG/2ML IJ SOLN: 4.0000 mg | Freq: Once | INTRAMUSCULAR | Status: DC | PRN

## 2021-04-21 MED ORDER — CEFAZOLIN SODIUM-DEXTROSE 2-4 GM/100ML-% IV SOLN
2.0000 g | Freq: Four times a day (QID) | INTRAVENOUS | Status: AC
  Administered 2021-04-21 (×2): 2 g via INTRAVENOUS
  Filled 2021-04-21 (×2): qty 100

## 2021-04-21 MED ORDER — PROPOFOL 1000 MG/100ML IV EMUL
INTRAVENOUS | Status: AC
  Filled 2021-04-21: qty 100

## 2021-04-21 MED ORDER — PROPOFOL 500 MG/50ML IV EMUL
INTRAVENOUS | Status: DC | PRN
  Administered 2021-04-21: 55 ug/kg/min via INTRAVENOUS

## 2021-04-21 MED ORDER — OXYCODONE HCL 5 MG PO TABS: 5.0000 mg | ORAL_TABLET | Freq: Once | ORAL | Status: DC | PRN

## 2021-04-21 MED ORDER — BUPIVACAINE-EPINEPHRINE (PF) 0.25% -1:200000 IJ SOLN
INTRAMUSCULAR | Status: DC | PRN
  Administered 2021-04-21: 30 mL

## 2021-04-21 MED ORDER — ACETAMINOPHEN 325 MG PO TABS: 325.0000 mg | ORAL_TABLET | Freq: Four times a day (QID) | ORAL | Status: DC | PRN

## 2021-04-21 MED ORDER — FERROUS SULFATE 325 (65 FE) MG PO TABS
325.0000 mg | ORAL_TABLET | Freq: Three times a day (TID) | ORAL | Status: DC
  Administered 2021-04-21 – 2021-04-22 (×4): 325 mg via ORAL
  Filled 2021-04-21 (×4): qty 1

## 2021-04-21 MED ORDER — ONDANSETRON HCL 4 MG/2ML IJ SOLN
INTRAMUSCULAR | Status: DC | PRN
  Administered 2021-04-21: 4 mg via INTRAVENOUS

## 2021-04-21 MED ORDER — PHENYLEPHRINE HCL-NACL 20-0.9 MG/250ML-% IV SOLN
INTRAVENOUS | Status: DC | PRN
  Administered 2021-04-21: 20 ug/min via INTRAVENOUS

## 2021-04-21 MED ORDER — CELECOXIB 200 MG PO CAPS
200.0000 mg | ORAL_CAPSULE | Freq: Two times a day (BID) | ORAL | Status: DC
  Administered 2021-04-21 – 2021-04-22 (×3): 200 mg via ORAL
  Filled 2021-04-21 (×3): qty 1

## 2021-04-21 MED ORDER — MENTHOL 3 MG MT LOZG: 1.0000 | LOZENGE | OROMUCOSAL | Status: DC | PRN

## 2021-04-21 MED ORDER — PROPOFOL 500 MG/50ML IV EMUL
INTRAVENOUS | Status: AC
  Filled 2021-04-21: qty 50

## 2021-04-21 MED ORDER — BUPIVACAINE-EPINEPHRINE (PF) 0.25% -1:200000 IJ SOLN
INTRAMUSCULAR | Status: AC
  Filled 2021-04-21: qty 30

## 2021-04-21 MED ORDER — INDAPAMIDE 1.25 MG PO TABS
2.5000 mg | ORAL_TABLET | Freq: Every day | ORAL | Status: DC
  Administered 2021-04-22: 2.5 mg via ORAL
  Filled 2021-04-21: qty 2

## 2021-04-21 MED ORDER — PHENYLEPHRINE HCL (PRESSORS) 10 MG/ML IV SOLN
INTRAVENOUS | Status: AC
  Filled 2021-04-21: qty 2

## 2021-04-21 MED ORDER — BUPIVACAINE IN DEXTROSE 0.75-8.25 % IT SOLN
INTRATHECAL | Status: DC | PRN
  Administered 2021-04-21: 1.8 mL via INTRATHECAL

## 2021-04-21 MED ORDER — INSULIN ASPART 100 UNIT/ML IJ SOLN
0.0000 [IU] | Freq: Three times a day (TID) | INTRAMUSCULAR | Status: DC
  Administered 2021-04-21: 15 [IU] via SUBCUTANEOUS
  Administered 2021-04-22 (×2): 5 [IU] via SUBCUTANEOUS

## 2021-04-21 MED ORDER — RIVAROXABAN 10 MG PO TABS
10.0000 mg | ORAL_TABLET | Freq: Every day | ORAL | Status: DC
  Administered 2021-04-22: 10 mg via ORAL
  Filled 2021-04-21: qty 1

## 2021-04-21 MED ORDER — DEXAMETHASONE SODIUM PHOSPHATE 10 MG/ML IJ SOLN
INTRAMUSCULAR | Status: AC
  Filled 2021-04-21: qty 1

## 2021-04-21 MED ORDER — CEFAZOLIN SODIUM-DEXTROSE 2-4 GM/100ML-% IV SOLN: 2.0000 g | INTRAVENOUS | Status: DC

## 2021-04-21 MED ORDER — EPHEDRINE SULFATE-NACL 50-0.9 MG/10ML-% IV SOSY
PREFILLED_SYRINGE | INTRAVENOUS | Status: DC | PRN
  Administered 2021-04-21: 5 mg via INTRAVENOUS

## 2021-04-21 MED ORDER — KETOROLAC TROMETHAMINE 30 MG/ML IJ SOLN
INTRAMUSCULAR | Status: DC | PRN
  Administered 2021-04-21: 30 mg

## 2021-04-21 MED ORDER — ATORVASTATIN CALCIUM 20 MG PO TABS
20.0000 mg | ORAL_TABLET | Freq: Every day | ORAL | Status: DC
  Administered 2021-04-21: 20 mg via ORAL
  Filled 2021-04-21: qty 1

## 2021-04-21 MED ORDER — ONDANSETRON HCL 4 MG/2ML IJ SOLN: 4.0000 mg | Freq: Four times a day (QID) | INTRAMUSCULAR | Status: DC | PRN

## 2021-04-21 MED ORDER — BISACODYL 10 MG RE SUPP: 10.0000 mg | Freq: Every day | RECTAL | Status: DC | PRN

## 2021-04-21 MED ORDER — HYDROMORPHONE HCL 1 MG/ML IJ SOLN
0.5000 mg | INTRAMUSCULAR | Status: DC | PRN
  Administered 2021-04-21: 1 mg via INTRAVENOUS
  Filled 2021-04-21: qty 1

## 2021-04-21 MED ORDER — TRANEXAMIC ACID-NACL 1000-0.7 MG/100ML-% IV SOLN: 1000.0000 mg | INTRAVENOUS | Status: DC

## 2021-04-21 MED ORDER — ALLOPURINOL 300 MG PO TABS: 300.0000 mg | ORAL_TABLET | Freq: Every day | ORAL | Status: DC | PRN

## 2021-04-21 MED ORDER — DEXAMETHASONE SODIUM PHOSPHATE 10 MG/ML IJ SOLN: 8.0000 mg | Freq: Once | INTRAMUSCULAR | Status: DC

## 2021-04-21 MED ORDER — METOCLOPRAMIDE HCL 5 MG/ML IJ SOLN: 5.0000 mg | Freq: Three times a day (TID) | INTRAMUSCULAR | Status: DC | PRN

## 2021-04-21 MED ORDER — OXYCODONE HCL 5 MG PO TABS
10.0000 mg | ORAL_TABLET | ORAL | Status: DC | PRN
  Administered 2021-04-21: 15 mg via ORAL
  Filled 2021-04-21 (×2): qty 3

## 2021-04-21 MED ORDER — 0.9 % SODIUM CHLORIDE (POUR BTL) OPTIME
TOPICAL | Status: DC | PRN
  Administered 2021-04-21: 1000 mL

## 2021-04-21 MED ORDER — ORAL CARE MOUTH RINSE: 15.0000 mL | Freq: Once | OROMUCOSAL | Status: AC

## 2021-04-21 MED ORDER — ONDANSETRON HCL 4 MG/2ML IJ SOLN
INTRAMUSCULAR | Status: AC
  Filled 2021-04-21: qty 2

## 2021-04-21 MED ORDER — PROPOFOL 10 MG/ML IV BOLUS
INTRAVENOUS | Status: DC | PRN
  Administered 2021-04-21: 20 mg via INTRAVENOUS

## 2021-04-21 MED ORDER — POVIDONE-IODINE 10 % EX SWAB
2.0000 "application " | Freq: Once | CUTANEOUS | Status: AC
  Administered 2021-04-21: 2 via TOPICAL

## 2021-04-21 MED ORDER — CLONIDINE HCL 0.1 MG PO TABS
0.1000 mg | ORAL_TABLET | Freq: Two times a day (BID) | ORAL | Status: DC
  Administered 2021-04-21 – 2021-04-22 (×2): 0.1 mg via ORAL
  Filled 2021-04-21 (×3): qty 1

## 2021-04-21 MED ORDER — SODIUM CHLORIDE 0.9 % IR SOLN
Status: DC | PRN
  Administered 2021-04-21: 1000 mL

## 2021-04-21 MED ORDER — ARTIFICIAL TEARS OPHTHALMIC OINT
TOPICAL_OINTMENT | OPHTHALMIC | Status: AC
  Filled 2021-04-21: qty 3.5

## 2021-04-21 MED ORDER — PHENOL 1.4 % MT LIQD: 1.0000 | OROMUCOSAL | Status: DC | PRN

## 2021-04-21 MED ORDER — AMLODIPINE BESYLATE 5 MG PO TABS
5.0000 mg | ORAL_TABLET | Freq: Every day | ORAL | Status: DC
  Administered 2021-04-22: 5 mg via ORAL
  Filled 2021-04-21: qty 1

## 2021-04-21 MED ORDER — OXYCODONE HCL 5 MG/5ML PO SOLN: 5.0000 mg | Freq: Once | ORAL | Status: DC | PRN

## 2021-04-21 MED ORDER — KETOROLAC TROMETHAMINE 30 MG/ML IJ SOLN
INTRAMUSCULAR | Status: AC
  Filled 2021-04-21: qty 1

## 2021-04-21 MED ORDER — ACETAMINOPHEN 500 MG PO TABS
1000.0000 mg | ORAL_TABLET | Freq: Once | ORAL | Status: AC
  Administered 2021-04-21: 1000 mg via ORAL
  Filled 2021-04-21: qty 2

## 2021-04-21 MED ORDER — METHOCARBAMOL 1000 MG/10ML IJ SOLN
500.0000 mg | Freq: Four times a day (QID) | INTRAVENOUS | Status: DC | PRN
  Filled 2021-04-21: qty 5

## 2021-04-21 MED ORDER — TRANEXAMIC ACID-NACL 1000-0.7 MG/100ML-% IV SOLN
1000.0000 mg | Freq: Once | INTRAVENOUS | Status: AC
  Administered 2021-04-21: 1000 mg via INTRAVENOUS
  Filled 2021-04-21: qty 100

## 2021-04-21 MED ORDER — SODIUM CHLORIDE 0.9 % IV SOLN: INTRAVENOUS | Status: DC

## 2021-04-21 MED ORDER — METOCLOPRAMIDE HCL 5 MG PO TABS: 5.0000 mg | ORAL_TABLET | Freq: Three times a day (TID) | ORAL | Status: DC | PRN

## 2021-04-21 SURGICAL SUPPLY — 55 items
ADH SKN CLS APL DERMABOND .7 (GAUZE/BANDAGES/DRESSINGS) ×1
ATTUNE MED ANAT PAT 41 KNEE (Knees) ×2 IMPLANT
ATTUNE PS FEM RT SZ 7 CEM KNEE (Femur) ×2 IMPLANT
ATTUNE PSRP INSR SZ7 7 KNEE (Insert) ×2 IMPLANT
BAG COUNTER SPONGE SURGICOUNT (BAG) IMPLANT
BAG SPEC THK2 15X12 ZIP CLS (MISCELLANEOUS)
BAG SPNG CNTER NS LX DISP (BAG)
BAG ZIPLOCK 12X15 (MISCELLANEOUS) IMPLANT
BASE TIBIAL ROT PLAT SZ 8 KNEE (Knees) ×1 IMPLANT
BLADE SAW SGTL 11.0X1.19X90.0M (BLADE) IMPLANT
BLADE SAW SGTL 13.0X1.19X90.0M (BLADE) ×2 IMPLANT
BLADE SURG SZ10 CARB STEEL (BLADE) ×4 IMPLANT
BNDG ELASTIC 6X5.8 VLCR STR LF (GAUZE/BANDAGES/DRESSINGS) ×2 IMPLANT
BOWL SMART MIX CTS (DISPOSABLE) ×2 IMPLANT
BSPLAT TIB 8 CMNT ROT PLAT STR (Knees) ×1 IMPLANT
CEMENT HV SMART SET (Cement) ×4 IMPLANT
CUFF TOURN SGL QUICK 34 (TOURNIQUET CUFF) ×2
CUFF TRNQT CYL 34X4.125X (TOURNIQUET CUFF) ×1 IMPLANT
DECANTER SPIKE VIAL GLASS SM (MISCELLANEOUS) ×4 IMPLANT
DERMABOND ADVANCED (GAUZE/BANDAGES/DRESSINGS) ×1
DERMABOND ADVANCED .7 DNX12 (GAUZE/BANDAGES/DRESSINGS) ×1 IMPLANT
DRAPE INCISE IOBAN 66X45 STRL (DRAPES) ×2 IMPLANT
DRAPE U-SHAPE 47X51 STRL (DRAPES) ×2 IMPLANT
DRESSING AQUACEL AG SP 3.5X10 (GAUZE/BANDAGES/DRESSINGS) ×1 IMPLANT
DRSG AQUACEL AG ADV 3.5X10 (GAUZE/BANDAGES/DRESSINGS) ×2 IMPLANT
DRSG AQUACEL AG SP 3.5X10 (GAUZE/BANDAGES/DRESSINGS) ×2
DURAPREP 26ML APPLICATOR (WOUND CARE) ×4 IMPLANT
ELECT REM PT RETURN 15FT ADLT (MISCELLANEOUS) ×2 IMPLANT
GLOVE SURG ENC MOIS LTX SZ6 (GLOVE) ×2 IMPLANT
GLOVE SURG ENC MOIS LTX SZ7 (GLOVE) ×2 IMPLANT
GLOVE SURG POLY ORTHO LF SZ6.5 (GLOVE) ×2 IMPLANT
GLOVE SURG UNDER POLY LF SZ7.5 (GLOVE) ×2 IMPLANT
GOWN STRL REUS W/TWL LRG LVL3 (GOWN DISPOSABLE) ×2 IMPLANT
HANDPIECE INTERPULSE COAX TIP (DISPOSABLE) ×2
HOLDER FOLEY CATH W/STRAP (MISCELLANEOUS) IMPLANT
KIT TURNOVER KIT A (KITS) IMPLANT
MANIFOLD NEPTUNE II (INSTRUMENTS) ×2 IMPLANT
NDL SAFETY ECLIPSE 18X1.5 (NEEDLE) IMPLANT
NEEDLE HYPO 18GX1.5 SHARP (NEEDLE)
NS IRRIG 1000ML POUR BTL (IV SOLUTION) ×2 IMPLANT
PACK TOTAL KNEE CUSTOM (KITS) ×2 IMPLANT
PROTECTOR NERVE ULNAR (MISCELLANEOUS) ×2 IMPLANT
SET HNDPC FAN SPRY TIP SCT (DISPOSABLE) ×1 IMPLANT
SET PAD KNEE POSITIONER (MISCELLANEOUS) ×2 IMPLANT
SUT MNCRL AB 4-0 PS2 18 (SUTURE) ×2 IMPLANT
SUT STRATAFIX PDS+ 0 24IN (SUTURE) ×2 IMPLANT
SUT VIC AB 1 CT1 36 (SUTURE) ×2 IMPLANT
SUT VIC AB 2-0 CT1 27 (SUTURE) ×6
SUT VIC AB 2-0 CT1 TAPERPNT 27 (SUTURE) ×3 IMPLANT
SYR 3ML LL SCALE MARK (SYRINGE) ×2 IMPLANT
TIBIAL BASE ROT PLAT SZ 8 KNEE (Knees) ×2 IMPLANT
TRAY FOLEY MTR SLVR 16FR STAT (SET/KITS/TRAYS/PACK) ×2 IMPLANT
TUBE SUCTION HIGH CAP CLEAR NV (SUCTIONS) ×2 IMPLANT
WATER STERILE IRR 1000ML POUR (IV SOLUTION) ×4 IMPLANT
WRAP KNEE MAXI GEL POST OP (GAUZE/BANDAGES/DRESSINGS) ×2 IMPLANT

## 2021-04-21 NOTE — Discharge Instructions (Addendum)

## 2021-04-21 NOTE — Transfer of Care (Signed)
Immediate Anesthesia Transfer of Care Note  Patient: Joe Rivera  Procedure(s) Performed: TOTAL KNEE ARTHROPLASTY (Right: Knee)  Patient Location: PACU  Anesthesia Type:MAC and Spinal  Level of Consciousness: awake, alert , oriented and patient cooperative  Airway & Oxygen Therapy: Patient Spontanous Breathing and Patient connected to face mask oxygen  Post-op Assessment: Report given to RN and Post -op Vital signs reviewed and stable  Post vital signs: Reviewed and stable  Last Vitals:  Vitals Value Taken Time  BP 102/63 04/21/21 1052  Temp    Pulse 70 04/21/21 1055  Resp 22 04/21/21 1055  SpO2 100 % 04/21/21 1055  Vitals shown include unvalidated device data.  Last Pain:  Vitals:   04/21/21 0651  TempSrc:   PainSc: 5       Patients Stated Pain Goal: 4 (20/10/07 1219)  Complications: No notable events documented.

## 2021-04-21 NOTE — Anesthesia Procedure Notes (Signed)
Spinal  Start time: 04/21/2021 8:55 AM End time: 04/21/2021 9:01 AM Reason for block: surgical anesthesia Staffing Performed: resident/CRNA  Resident/CRNA: Victoriano Lain, CRNA Preanesthetic Checklist Completed: patient identified, IV checked, site marked, risks and benefits discussed, surgical consent, monitors and equipment checked, pre-op evaluation and timeout performed Spinal Block Patient position: sitting Prep: DuraPrep and site prepped and draped Patient monitoring: heart rate, blood pressure and continuous pulse ox Approach: midline Location: L2-3 Injection technique: single-shot Needle Needle type: Pencan  Needle gauge: 24 G Needle length: 10 cm Assessment Sensory level: T4 Events: CSF return Additional Notes Pt placed in sitting position for spinal placement. Sterile prep and drape of back. Two attempts by CRNA. + clear CSF, - heme. Pt tolerated procedure well. Placed supine

## 2021-04-21 NOTE — Anesthesia Postprocedure Evaluation (Signed)
Anesthesia Post Note  Patient: [name]  Procedure(s) Performed: TOTAL KNEE ARTHROPLASTY (Right: Knee)     Patient location during evaluation: PACU Anesthesia Type: Regional and Spinal Level of consciousness: oriented and awake and alert Pain management: pain level controlled Vital Signs Assessment: post-procedure vital signs reviewed and stable Respiratory status: spontaneous breathing and respiratory function stable Cardiovascular status: blood pressure returned to baseline and stable Postop Assessment: no headache, no backache, no apparent nausea or vomiting and patient able to bend at knees Anesthetic complications: no   No notable events documented.  Last Vitals:  Vitals:   04/21/21 1200 04/21/21 1213  BP:  122/74  Pulse:  66  Resp:  17  Temp: 36.5 C 36.4 C  SpO2:  97%    Last Pain:  Vitals:   04/21/21 1231  TempSrc:   PainSc: 0-No pain                 Merlinda Frederick

## 2021-04-21 NOTE — Interval H&P Note (Signed)
History and Physical Interval Note:  04/21/2021 7:16 AM  Joe Rivera  has presented today for surgery, with the diagnosis of Right knee osteoarthritis.  The various methods of treatment have been discussed with the patient and family. After consideration of risks, benefits and other options for treatment, the patient has consented to  Procedure(s): TOTAL KNEE ARTHROPLASTY (Right) as a surgical intervention.  The patient's history has been reviewed, patient examined, no change in status, stable for surgery.  I have reviewed the patient's chart and labs.  Questions were answered to the patient's satisfaction.     Mauri Pole

## 2021-04-21 NOTE — Anesthesia Procedure Notes (Signed)
Anesthesia Regional Block: Adductor canal block   Pre-Anesthetic Checklist: , timeout performed,  Correct Patient, Correct Site, Correct Laterality,  Correct Procedure, Correct Position, site marked,  Risks and benefits discussed,  Surgical consent,  Pre-op evaluation,  At surgeon's request and post-op pain management  Laterality: Right  Prep: chloraprep       Needles:  Injection technique: Single-shot  Needle Type: Echogenic Stimulator Needle     Needle Length: 10cm      Additional Needles:   Procedures:,,,, ultrasound used (permanent image in chart),,    Narrative:  Start time: 04/21/2021 7:40 AM End time: 04/21/2021 7:45 AM Injection made incrementally with aspirations every 5 mL.  Performed by: Personally  Anesthesiologist: Merlinda Frederick, MD  Additional Notes: A functioning IV was confirmed and monitors were applied.  Sterile prep and drape, hand hygiene and sterile gloves were used.  Negative aspiration and test dose prior to incremental administration of local anesthetic. The patient tolerated the procedure well.Ultrasound  guidance: relevant anatomy identified, needle position confirmed, local anesthetic spread visualized around nerve(s), vascular puncture avoided.  Image printed for medical record.

## 2021-04-21 NOTE — Progress Notes (Signed)
AssistedDr. Elgie Congo with right, ultrasound guided, adductor canal block. Side rails up, monitors on throughout procedure. See vital signs in flow sheet. Tolerated Procedure well.

## 2021-04-21 NOTE — Evaluation (Signed)
Physical Therapy Evaluation Patient Details Name: Joe Rivera MRN: [id number] DOB: 08/09/1945 Today's Date: 04/21/2021  History of Present Illness  Patient is 75 y.o. male s/p Rt TKA on 10/25/2 with PMH significant for OA, DM, gout, HTN, anxiety, DVT, ACDF.  Clinical Impression  Joe Rivera is a 75 y.o. male POD 0 s/p Rt TKA. Patient reports independence with mobility at baseline. Patient is now limited by functional impairments (see PT problem list below) and requires min assist for transfers and gait with RW. Patient was able to ambulate ~20 feet with RW and min assist. Patient instructed in exercise to facilitate circulation to manage edema to reduce risk of DVT. Patient will benefit from continued skilled PT interventions to address impairments and progress towards PLOF. Acute PT will follow to progress mobility and stair training in preparation for safe discharge home.        Recommendations for follow up therapy are one component of a multi-disciplinary discharge planning process, led by the attending physician.  Recommendations may be updated based on patient status, additional functional criteria and insurance authorization.  Follow Up Recommendations Follow physician's recommendations for discharge plan and follow up therapies    Assistance Recommended at Discharge Intermittent Supervision/Assistance  Functional Status Assessment Patient has had a recent decline in their functional status and demonstrates the ability to make significant improvements in function in a reasonable and predictable amount of time.  Equipment Recommendations  Rolling walker (2 wheels)    Recommendations for Other Services       Precautions / Restrictions Precautions Precautions: Fall Restrictions Weight Bearing Restrictions: No Other Position/Activity Restrictions: WBAT      Mobility  Bed Mobility Overal bed mobility: Needs Assistance Bed Mobility: Supine to Sit     Supine to sit: Min  assist;HOB elevated     General bed mobility comments: cues to use bed rail and light assist for Rt LE off EOB, pt able to press trunk up from elevated HOB.    Transfers Overall transfer level: Needs assistance Equipment used: Rolling walker (2 wheels) Transfers: Sit to/from Stand Sit to Stand: Min assist           General transfer comment: cues for hand placement for power up, assist needed to initiate rise from EOB and to steady with hand transition to RW.    Ambulation/Gait Ambulation/Gait assistance: Min assist Gait Distance (Feet): 20 Feet Assistive device: Rolling walker (2 wheels) Gait Pattern/deviations: Step-to pattern;Decreased stride length;Decreased weight shift to right;Decreased stance time - right Gait velocity: decr   General Gait Details: cues for step to pattern and use of UE's to reduce pressure on Rt knee for pain management. no buckling or LOB noted. distance limited for pain.  Stairs            Wheelchair Mobility    Modified Rankin (Stroke Patients Only)       Balance Overall balance assessment: Needs assistance Sitting-balance support: Feet supported       Standing balance support: Reliant on assistive device for balance;During functional activity;Bilateral upper extremity supported Standing balance-Leahy Scale: Poor                               Pertinent Vitals/Pain Pain Assessment: 0-10 Pain Score: 6  Pain Location: Rt knee Pain Descriptors / Indicators: Aching Pain Intervention(s): Limited activity within patient's tolerance;Monitored during session;Repositioned    Home Living Family/patient expects to be discharged to:: Private residence Living Arrangements: Spouse/significant  other Available Help at Discharge: Family Type of Home: House Home Access: Stairs to enter Entrance Stairs-Rails: None Entrance Stairs-Number of Steps: 1 Alternate Level Stairs-Number of Steps: 14 Home Layout: Two level;Bed/bath  upstairs;1/2 bath on main level Home Equipment: None      Prior Function Prior Level of Function : Independent/Modified Independent                     Hand Dominance   Dominant Hand: Right    Extremity/Trunk Assessment   Upper Extremity Assessment Upper Extremity Assessment: Overall WFL for tasks assessed    Lower Extremity Assessment Lower Extremity Assessment: RLE deficits/detail RLE Deficits / Details: good quad activation, no extensor lag with SLR RLE Sensation: WNL RLE Coordination: WNL    Cervical / Trunk Assessment Cervical / Trunk Assessment: Normal  Communication   Communication: No difficulties  Cognition Arousal/Alertness: Awake/alert Behavior During Therapy: WFL for tasks assessed/performed Overall Cognitive Status: Within Functional Limits for tasks assessed                                          General Comments      Exercises Total Joint Exercises Ankle Circles/Pumps: AROM;Both;20 reps;Seated   Assessment/Plan    PT Assessment Patient needs continued PT services  PT Problem List Decreased strength;Decreased range of motion;Decreased activity tolerance;Decreased balance;Decreased mobility;Decreased knowledge of use of DME;Decreased knowledge of precautions;Pain       PT Treatment Interventions DME instruction;Gait training;Stair training;Functional mobility training;Therapeutic activities;Therapeutic exercise;Balance training;Patient/family education    PT Goals (Current goals can be found in the Care Plan section)  Acute Rehab PT Goals Patient Stated Goal: get back independence PT Goal Formulation: With patient Time For Goal Achievement: 04/28/21 Potential to Achieve Goals: Good    Frequency 7X/week   Barriers to discharge        Co-evaluation               AM-PAC PT "6 Clicks" Mobility  Outcome Measure Help needed turning from your back to your side while in a flat bed without using bedrails?: A  Little Help needed moving from lying on your back to sitting on the side of a flat bed without using bedrails?: A Little Help needed moving to and from a bed to a chair (including a wheelchair)?: A Little Help needed standing up from a chair using your arms (e.g., wheelchair or bedside chair)?: A Little Help needed to walk in hospital room?: A Little Help needed climbing 3-5 steps with a railing? : A Lot 6 Click Score: 17    End of Session Equipment Utilized During Treatment: Gait belt Activity Tolerance: Patient tolerated treatment well Patient left: in chair;with call bell/phone within reach;with chair alarm set Nurse Communication: Mobility status;Patient requests pain meds PT Visit Diagnosis: Muscle weakness (generalized) (M62.81);Difficulty in walking, not elsewhere classified (R26.2)    Time: 7673-4193 PT Time Calculation (min) (ACUTE ONLY): 20 min   Charges:   PT Evaluation $PT Eval Low Complexity: 1 Low          Verner Mould, DPT Acute Rehabilitation Services Office [phone] Pager [phone]   Jacques Navy 04/21/2021, 7:04 PM

## 2021-04-21 NOTE — Op Note (Signed)
NAME:  Joe Rivera                      MEDICAL RECORD NO.:  [id number]                             FACILITY:  [facility]      PHYSICIAN:  Pietro Cassis. Alvan Dame, M.D.  DATE OF BIRTH:  [birth year]      DATE OF PROCEDURE:  04/21/2021                                     OPERATIVE REPORT         PREOPERATIVE DIAGNOSIS:  Right knee osteoarthritis.      POSTOPERATIVE DIAGNOSIS:  Right knee osteoarthritis.      FINDINGS:  The patient was noted to have complete loss of cartilage and   bone-on-bone arthritis with associated osteophytes in the medial and patellofemoral compartments of   the knee with a significant synovitis and associated effusion.  The patient had failed months of conservative treatment including medications, injection therapy, activity modification.     PROCEDURE:  Right total knee replacement.      COMPONENTS USED:  DePuy Attune rotating platform posterior stabilized knee   system, a size 7 femur, 8 tibia, size 7 mm PS AOX insert, and 41 anatomic patellar   button.      SURGEON:  Pietro Cassis. Alvan Dame, M.D.      ASSISTANT:  Costella Hatcher, PA-C.      ANESTHESIA:  Regional and Spinal.      SPECIMENS:  None.      COMPLICATION:  None.      DRAINS:  None.  EBL: <100cc      TOURNIQUET TIME:  34 min at 225 mmHg     The patient was stable to the recovery room.      INDICATION FOR PROCEDURE:  Joe Rivera is a 75 y.o. male patient of   mine.  The patient had been seen, evaluated, and treated for months conservatively in the   office with medication, activity modification, and injections.  The patient had   radiographic changes of bone-on-bone arthritis with endplate sclerosis and osteophytes noted.  Based on the radiographic changes and failed conservative measures, the patient   decided to proceed with definitive treatment, total knee replacement.  Risks of infection, DVT, component failure, need for revision surgery, neurovascular injury were reviewed in the office setting.   The postop course was reviewed stressing the efforts to maximize post-operative satisfaction and function.  Consent was obtained for benefit of pain   relief.      PROCEDURE IN DETAIL:  The patient was brought to the operative theater.   Once adequate anesthesia, preoperative antibiotics, 2 gm of Ancef,1 gm of Tranexamic Acid, and 10 mg of Decadron administered, the patient was positioned supine with a right thigh tourniquet placed.  The  right lower extremity was prepped and draped in sterile fashion.  A time-   out was performed identifying the patient, planned procedure, and the appropriate extremity.      The right lower extremity was placed in the [facility] leg holder.  The leg was   exsanguinated, tourniquet elevated to 250 mmHg.  A midline incision was   made followed by median parapatellar arthrotomy.  Following initial   exposure, attention was first directed to  the patella.  Precut   measurement was noted to be 26 mm.  I resected down to 14 mm and used a   41 anatomic patellar button to restore patellar height as well as cover the cut surface.      The lug holes were drilled and a metal shim was placed to protect the   patella from retractors and saw blade during the procedure.      At this point, attention was now directed to the femur.  The femoral   canal was opened with a drill, irrigated to try to prevent fat emboli.  An   intramedullary rod was passed at 5 degrees valgus, 9 mm of bone was   resected off the distal femur.  Following this resection, the tibia was   subluxated anteriorly.  Using the extramedullary guide, 2 mm of bone was resected off   the proximal medial tibia.  We confirmed the gap would be   stable medially and laterally with a size 5 spacer block as well as confirmed that the tibial cut was perpendicular in the coronal plane, checking with an alignment rod.      Once this was done, I sized the femur to be a size 7 in the anterior-   posterior dimension, chose  a standard component based on medial and   lateral dimension.  The size 7 rotation block was then pinned in   position anterior referenced using the C-clamp to set rotation.  The   anterior, posterior, and  chamfer cuts were made without difficulty nor   notching making certain that I was along the anterior cortex to help   with flexion gap stability.      The final box cut was made off the lateral aspect of distal femur.      At this point, the tibia was sized to be a size 8.  The size 8 tray was   then pinned in position through the medial third of the tubercle,   drilled, and keel punched.  Trial reduction was now carried with a 7 femur,  8 tibia, a size 7 mm PS insert, and the 41 anatomic patella botton.  The knee was brought to full extension with good flexion stability with the patella   tracking through the trochlea without application of pressure.  Given   all these findings the trial components removed.  Final components were   opened and cement was mixed.  The knee was irrigated with normal saline solution and pulse lavage.  The synovial lining was   then injected with 30 cc of 0.25% Marcaine with epinephrine, 1 cc of Toradol and 30 cc of NS for a total of 61 cc.     Final implants were then cemented onto cleaned and dried cut surfaces of bone with the knee brought to extension with a size 7 mm PS trial insert.      Once the cement had fully cured, excess cement was removed   throughout the knee.  I confirmed that I was satisfied with the range of   motion and stability, and the final size 7 mm PS AOX insert was chosen.  It was   placed into the knee.      The tourniquet had been let down at 34 minutes.  No significant   hemostasis was required.  The extensor mechanism was then reapproximated using #1 Vicryl and #1 Stratafix sutures with the knee   in flexion.  The   remaining wound  was closed with 2-0 Vicryl and running 4-0 Monocryl.   The knee was cleaned, dried, dressed  sterilely using Dermabond and   Aquacel dressing.  The patient was then   brought to recovery room in stable condition, tolerating the procedure   well.   Please note that Physician Assistant, Costella Hatcher, PA-C was present for the entirety of the case, and was utilized for pre-operative positioning, peri-operative retractor management, general facilitation of the procedure and for primary wound closure at the end of the case.              Pietro Cassis Alvan Dame, M.D.    04/21/2021 8:56 AM

## 2021-04-22 DIAGNOSIS — M1711 Unilateral primary osteoarthritis, right knee: Secondary | ICD-10-CM | POA: Diagnosis not present

## 2021-04-22 LAB — BASIC METABOLIC PANEL
Anion gap: 7 (ref 5–15)
BUN: 14 mg/dL (ref 8–23)
CO2: 29 mmol/L (ref 22–32)
Calcium: 8.1 mg/dL — ABNORMAL LOW (ref 8.9–10.3)
Chloride: 94 mmol/L — ABNORMAL LOW (ref 98–111)
Creatinine, Ser: 0.81 mg/dL (ref 0.61–1.24)
GFR, Estimated: >60 mL/min (ref >60)
Glucose, Bld: 269 mg/dL — ABNORMAL HIGH (ref 70–99)
Potassium: 3.6 mmol/L (ref 3.5–5.1)
Sodium: 130 mmol/L — ABNORMAL LOW (ref 135–145)

## 2021-04-22 LAB — CBC
HCT: 35.1 % — ABNORMAL LOW (ref 39.0–52.0)
Hemoglobin: 12.5 g/dL — ABNORMAL LOW (ref 13.0–17.0)
MCH: 35.1 pg — ABNORMAL HIGH (ref 26.0–34.0)
MCHC: 35.6 g/dL (ref 30.0–36.0)
MCV: 98.6 fL (ref 80.0–100.0)
Platelets: 142 10*3/uL — ABNORMAL LOW (ref 150–400)
RBC: 3.56 MIL/uL — ABNORMAL LOW (ref 4.22–5.81)
RDW: 11.9 % (ref 11.5–15.5)
WBC: 11.4 10*3/uL — ABNORMAL HIGH (ref 4.0–10.5)
nRBC: 0 % (ref 0.0–0.2)

## 2021-04-22 LAB — GLUCOSE, CAPILLARY
Glucose-Capillary: 281 mg/dL — ABNORMAL HIGH (ref 70–99)
Glucose-Capillary: 218 mg/dL — ABNORMAL HIGH (ref 70–99)
Glucose-Capillary: 215 mg/dL — ABNORMAL HIGH (ref 70–99)

## 2021-04-22 MED ORDER — RIVAROXABAN 10 MG PO TABS: 10.0000 mg | ORAL_TABLET | Freq: Every day | ORAL | 0 refills | Status: DC

## 2021-04-22 MED ORDER — METHOCARBAMOL 500 MG PO TABS: 500.0000 mg | ORAL_TABLET | Freq: Four times a day (QID) | ORAL | 0 refills | Status: AC | PRN

## 2021-04-22 MED ORDER — SALINE SPRAY 0.65 % NA SOLN
1.0000 | NASAL | Status: DC | PRN
  Administered 2021-04-22: 1 via NASAL
  Filled 2021-04-22: qty 44

## 2021-04-22 MED ORDER — ACETAMINOPHEN 325 MG PO TABS: 1000.0000 mg | ORAL_TABLET | Freq: Three times a day (TID) | ORAL | Status: AC

## 2021-04-22 MED ORDER — POLYETHYLENE GLYCOL 3350 17 G PO PACK: 17.0000 g | PACK | Freq: Every day | ORAL | 0 refills | Status: AC | PRN

## 2021-04-22 MED ORDER — DOCUSATE SODIUM 100 MG PO CAPS: 100.0000 mg | ORAL_CAPSULE | Freq: Two times a day (BID) | ORAL | 0 refills | Status: AC

## 2021-04-22 MED ORDER — CELECOXIB 200 MG PO CAPS: 200.0000 mg | ORAL_CAPSULE | Freq: Every day | ORAL | 0 refills | Status: DC

## 2021-04-22 MED ORDER — OXYCODONE HCL 5 MG PO TABS: 5.0000 mg | ORAL_TABLET | ORAL | 0 refills | Status: AC | PRN

## 2021-04-22 NOTE — TOC Transition Note (Addendum)
Transition of Care [facility]) - CM/SW Discharge Note  Patient Details  Name: Quade Ramirez MRN: [id number] Date of Birth: [birth year]  [facility] CM/SW Contact:  Sherie Don, LCSW Phone Number: 04/22/2021, 10:01 AM  Clinical Narrative: Patient is expected to discharge after working with PT. CSW met with patient to confirm discharge plan and needs. Patient will discharge home with OPPT at [facility] in [place]. Patient's wife will pick up patient's ice machine from the [place] today and he will borrow a rolling walker from his mother-in-law. TOC signing off.  Addendum: Patient will not be able to use his MIL's walker and will need one through his HTA. CSW spoke with wife to confirm patient will need it and informed wife he will need to use his HTA. CSW made referral to [facility] with [employer]. [employer] to drop ship walker to patient's home.  Final next level of care: OP Rehab Barriers to Discharge: No Barriers Identified  Patient Goals and CMS Choice Patient states their goals for this hospitalization and ongoing recovery are:: Discharge home with [place] CMS Medicare.gov Compare Post Acute Care list provided to:: Patient Choice offered to / list presented to : Patient  Discharge Plan and Services         DME Arranged: N/A DME Agency: NA  Readmission Risk Interventions No flowsheet data found.

## 2021-04-22 NOTE — Progress Notes (Signed)
Physical Therapy Treatment Patient Details Name: Joe Rivera MRN: [id number] DOB: 19-Jul-1945 Today's Date: 04/22/2021   History of Present Illness Patient is 75 y.o. male s/p Rt TKA on 10/25/2 with PMH significant for OA, DM, gout, HTN, anxiety, DVT, ACDF.    PT Comments    Progressing well. Reviewed full TKA HEP. Pt tol well. Ready to d/c with spouse assist as needed    Recommendations for follow up therapy are one component of a multi-disciplinary discharge planning process, led by the attending physician.  Recommendations may be updated based on patient status, additional functional criteria and insurance authorization.  Follow Up Recommendations  Follow physician's recommendations for discharge plan and follow up therapies     Assistance Recommended at Discharge Intermittent Supervision/Assistance  Equipment Recommendations  Rolling walker (2 wheels)    Recommendations for Other Services       Precautions / Restrictions Precautions Precautions: Knee Restrictions Weight Bearing Restrictions: No Other Position/Activity Restrictions: WBAT     Mobility  Bed Mobility Overal bed mobility: Needs Assistance Bed Mobility: Supine to Sit     Supine to sit: Supervision     General bed mobility comments: for safety    Transfers   Equipment used: Rolling walker (2 wheels) Transfers: Sit to/from Stand Sit to Stand: Supervision;Min guard           General transfer comment: cues for hand placement and RLE position    Ambulation/Gait Ambulation/Gait assistance: Min guard Gait Distance (Feet): 220 Feet Assistive device: Rolling walker (2 wheels) Gait Pattern/deviations: Step-to pattern;Step-through pattern;Decreased stance time - right Gait velocity: decr   General Gait Details: progression to step through gait, incr fluidity of gait with incr distance   Stairs Stairs: Yes Stairs assistance: Min guard Stair Management: One rail Right;One rail Left;Step to  pattern;Forwards Number of Stairs: 5 General stair comments: cues for sequence, initially sideways however pt able to tol forwards with one hand rail. no knee buckling or LOB   Wheelchair Mobility    Modified Rankin (Stroke Patients Only)       Balance                                            Cognition Arousal/Alertness: Awake/alert Behavior During Therapy: WFL for tasks assessed/performed Overall Cognitive Status: Within Functional Limits for tasks assessed                                          Exercises Total Joint Exercises Ankle Circles/Pumps: AROM;Both;20 reps;Seated Quad Sets: AROM;Both;10 reps Short Arc Quad: AROM;Right;10 reps Heel Slides: AROM;Right;10 reps Hip ABduction/ADduction: AROM;Right;10 reps Straight Leg Raises: AROM;Right;10 reps Long Arc Quad: AROM;Right;Seated    General Comments        Pertinent Vitals/Pain Pain Assessment: 0-10 Pain Score: 2  Pain Location: Rt knee Pain Descriptors / Indicators: Discomfort Pain Intervention(s): Limited activity within patient's tolerance;Monitored during session;Premedicated before session;Repositioned    Home Living                          Prior Function            PT Goals (current goals can now be found in the care plan section) Acute Rehab PT Goals PT Goal Formulation: With patient Time For  Goal Achievement: 04/28/21 Potential to Achieve Goals: Good Progress towards PT goals: Progressing toward goals    Frequency    7X/week      PT Plan Current plan remains appropriate    Co-evaluation              AM-PAC PT "6 Clicks" Mobility   Outcome Measure  Help needed turning from your back to your side while in a flat bed without using bedrails?: A Little Help needed moving from lying on your back to sitting on the side of a flat bed without using bedrails?: A Little Help needed moving to and from a bed to a chair (including a  wheelchair)?: A Little Help needed standing up from a chair using your arms (e.g., wheelchair or bedside chair)?: A Little Help needed to walk in hospital room?: A Little Help needed climbing 3-5 steps with a railing? : A Little 6 Click Score: 18    End of Session Equipment Utilized During Treatment: Gait belt Activity Tolerance: Patient tolerated treatment well Patient left: in chair;with call bell/phone within reach;with chair alarm set Nurse Communication: Mobility status PT Visit Diagnosis: Muscle weakness (generalized) (M62.81);Difficulty in walking, not elsewhere classified (R26.2)     Time: 1120-1140 PT Time Calculation (min) (ACUTE ONLY): 20 min  Charges:  $Gait Training: 8-22 mins $Therapeutic Exercise: 8-22 mins                     Joe Rivera, PT  Acute Rehab Dept ([place]) [phone] Pager [phone]  04/22/2021    Joe Rivera 04/22/2021, 2:25 PM

## 2021-04-22 NOTE — Progress Notes (Signed)
   Subjective: 1 Day Post-Op Procedure(s) (LRB): TOTAL KNEE ARTHROPLASTY (Right) Patient reports pain as mild.   Patient seen in rounds by Dr. Alvan Dame. Patient is well, and has had no acute complaints or problems. No acute events overnight. Foley catheter removed. Patient ambulated 20 feet with PT.  We will continue therapy today.   Objective: Vital signs in last 24 hours: Temp:  [97.6 F (36.4 C)-98.2 F (36.8 C)] 97.7 F (36.5 C) (10/26 0529) Pulse Rate:  [54-85] 62 (10/26 0529) Resp:  [14-23] 18 (10/26 0529) BP: (110-148)/(65-84) 148/77 (10/26 0529) SpO2:  [93 %-100 %] 96 % (10/26 0529)  Intake/Output from previous day:  Intake/Output Summary (Last 24 hours) at 04/22/2021 0715 Last data filed at 04/22/2021 0625 Gross per 24 hour  Intake 4784.38 ml  Output 3345 ml  Net 1439.38 ml     Intake/Output this shift: No intake/output data recorded.  Labs: Recent Labs    04/22/21 0334  HGB 12.5*   Recent Labs    04/22/21 0334  WBC 11.4*  RBC 3.56*  HCT 35.1*  PLT 142*   Recent Labs    04/22/21 0334  NA 130*  K 3.6  CL 94*  CO2 29  BUN 14  CREATININE 0.81  GLUCOSE 269*  CALCIUM 8.1*   No results for input(s): LABPT, INR in the last 72 hours.  Exam: General - Patient is Alert and Oriented Extremity - Neurologically intact Sensation intact distally Intact pulses distally Dorsiflexion/Plantar flexion intact Dressing - dressing C/D/I Motor Function - intact, moving foot and toes well on exam.   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   Weight as of this encounter: 116.6 kg. Advance diet Up with therapy D/C IV fluids   Patient's anticipated LOS is less than 2 midnights, meeting these requirements: - Younger than 25 - Lives within 1 hour of care - Has a competent adult at home to recover with post-op recover - NO history of  - Chronic pain requiring opiods  - Diabetes  - Coronary Artery Disease  - Heart failure  - Heart attack  - Stroke  - DVT/VTE  - Cardiac arrhythmia  - Respiratory Failure/COPD  - Renal failure  - Anemia  - Advanced Liver disease     DVT Prophylaxis - Xarelto Weight bearing as tolerated.  Plan is to go Home after hospital stay. Plan for discharge today following 1-2 sessions of PT as long as they are meeting their goals. Patient is scheduled for OPPT. Follow up in the office in 2 weeks.   He is interested in an ice machine, which is typically not covered by insurance. I will check with our office.   Griffith Citron, PA-C Orthopedic Surgery [phone] 04/22/2021, 7:15 AM

## 2021-04-22 NOTE — Progress Notes (Signed)
Physical Therapy Treatment Patient Details Name: Joe Rivera MRN: [id number] DOB: Feb 13, 1946 Today's Date: 04/22/2021   History of Present Illness Patient is 75 y.o. male s/p Rt TKA on 10/25/2 with PMH significant for OA, DM, gout, HTN, anxiety, DVT, ACDF.    PT Comments    Pt progressing well. Will see for a second session. Pt will likely be ready to d/c early afternoon. He is motivated to go home today   Recommendations for follow up therapy are one component of a multi-disciplinary discharge planning process, led by the attending physician.  Recommendations may be updated based on patient status, additional functional criteria and insurance authorization.  Follow Up Recommendations  Follow physician's recommendations for discharge plan and follow up therapies     Assistance Recommended at Discharge Intermittent Supervision/Assistance  Equipment Recommendations  Rolling walker (2 wheels)    Recommendations for Other Services       Precautions / Restrictions Precautions Precautions: Knee Restrictions Weight Bearing Restrictions: No Other Position/Activity Restrictions: WBAT     Mobility  Bed Mobility Overal bed mobility: Needs Assistance Bed Mobility: Supine to Sit     Supine to sit: Supervision     General bed mobility comments: for safety    Transfers   Equipment used: Rolling walker (2 wheels) Transfers: Sit to/from Stand Sit to Stand: Supervision;Min guard           General transfer comment: cues for hand placement and RLE position    Ambulation/Gait Ambulation/Gait assistance: Min guard Gait Distance (Feet): 220 Feet Assistive device: Rolling walker (2 wheels) Gait Pattern/deviations: Step-to pattern;Step-through pattern;Decreased stance time - right Gait velocity: decr   General Gait Details: progression to step through gait, incr fluidity of gait with incr distance   Stairs Stairs: Yes Stairs assistance: Min guard Stair Management: One  rail Right;One rail Left;Step to pattern;Forwards Number of Stairs: 5 General stair comments: cues for sequence, initially sideways however pt able to tol forwards with one hand rail. no knee buckling or LOB   Wheelchair Mobility    Modified Rankin (Stroke Patients Only)       Balance                                            Cognition Arousal/Alertness: Awake/alert Behavior During Therapy: WFL for tasks assessed/performed Overall Cognitive Status: Within Functional Limits for tasks assessed                                          Exercises      General Comments        Pertinent Vitals/Pain Pain Assessment: 0-10 Pain Score: 3  Pain Location: Rt knee Pain Descriptors / Indicators: Aching Pain Intervention(s): Limited activity within patient's tolerance;Monitored during session;Premedicated before session;Repositioned    Home Living                          Prior Function            PT Goals (current goals can now be found in the care plan section) Acute Rehab PT Goals PT Goal Formulation: With patient Time For Goal Achievement: 04/28/21 Potential to Achieve Goals: Good Progress towards PT goals: Progressing toward goals    Frequency    7X/week  PT Plan Current plan remains appropriate    Co-evaluation              AM-PAC PT "6 Clicks" Mobility   Outcome Measure  Help needed turning from your back to your side while in a flat bed without using bedrails?: A Little Help needed moving from lying on your back to sitting on the side of a flat bed without using bedrails?: A Little Help needed moving to and from a bed to a chair (including a wheelchair)?: A Little Help needed standing up from a chair using your arms (e.g., wheelchair or bedside chair)?: A Little Help needed to walk in hospital room?: A Little Help needed climbing 3-5 steps with a railing? : A Little 6 Click Score: 18    End of  Session Equipment Utilized During Treatment: Gait belt Activity Tolerance: Patient tolerated treatment well Patient left: in chair;with call bell/phone within reach;with chair alarm set Nurse Communication: Mobility status PT Visit Diagnosis: Muscle weakness (generalized) (M62.81);Difficulty in walking, not elsewhere classified (R26.2)     Time: 7583-0746 PT Time Calculation (min) (ACUTE ONLY): 20 min  Charges:  $Gait Training: 8-22 mins                     Baxter Flattery, PT  Acute Rehab Dept ([place]) [phone] Pager [phone]  04/22/2021    [facility] ([place]) 04/22/2021, 11:19 AM

## 2021-04-23 ENCOUNTER — Inpatient Hospital Stay (HOSPITAL_COMMUNITY)
Admission: EM | Admit: 2021-04-23 | Discharge: 2021-04-28 | DRG: 378 | Disposition: A | Discharge status: Home / Self Care | Payer: No Typology Code available for payment source | Attending: Internal Medicine | Admitting: Internal Medicine

## 2021-04-23 ENCOUNTER — Emergency Department (HOSPITAL_COMMUNITY): Payer: No Typology Code available for payment source

## 2021-04-23 DIAGNOSIS — K921 Melena: Secondary | ICD-10-CM | POA: Diagnosis not present

## 2021-04-23 DIAGNOSIS — E861 Hypovolemia: Secondary | ICD-10-CM | POA: Diagnosis present

## 2021-04-23 DIAGNOSIS — R600 Localized edema: Secondary | ICD-10-CM | POA: Diagnosis not present

## 2021-04-23 DIAGNOSIS — D649 Anemia, unspecified: Secondary | ICD-10-CM | POA: Diagnosis not present

## 2021-04-23 DIAGNOSIS — K259 Gastric ulcer, unspecified as acute or chronic, without hemorrhage or perforation: Secondary | ICD-10-CM | POA: Diagnosis not present

## 2021-04-23 DIAGNOSIS — E785 Hyperlipidemia, unspecified: Secondary | ICD-10-CM | POA: Diagnosis not present

## 2021-04-23 DIAGNOSIS — Z825 Family history of asthma and other chronic lower respiratory diseases: Secondary | ICD-10-CM | POA: Diagnosis not present

## 2021-04-23 DIAGNOSIS — T502X5A Adverse effect of carbonic-anhydrase inhibitors, benzothiadiazides and other diuretics, initial encounter: Secondary | ICD-10-CM | POA: Diagnosis present

## 2021-04-23 DIAGNOSIS — E119 Type 2 diabetes mellitus without complications: Secondary | ICD-10-CM | POA: Diagnosis not present

## 2021-04-23 DIAGNOSIS — Z79899 Other long term (current) drug therapy: Secondary | ICD-10-CM

## 2021-04-23 DIAGNOSIS — Z7901 Long term (current) use of anticoagulants: Secondary | ICD-10-CM

## 2021-04-23 DIAGNOSIS — Z803 Family history of malignant neoplasm of breast: Secondary | ICD-10-CM

## 2021-04-23 DIAGNOSIS — E872 Acidosis, unspecified: Secondary | ICD-10-CM | POA: Diagnosis present

## 2021-04-23 DIAGNOSIS — I10 Essential (primary) hypertension: Secondary | ICD-10-CM | POA: Diagnosis present

## 2021-04-23 DIAGNOSIS — E1165 Type 2 diabetes mellitus with hyperglycemia: Secondary | ICD-10-CM | POA: Diagnosis present

## 2021-04-23 DIAGNOSIS — K922 Gastrointestinal hemorrhage, unspecified: Secondary | ICD-10-CM | POA: Diagnosis present

## 2021-04-23 DIAGNOSIS — Z20822 Contact with and (suspected) exposure to covid-19: Secondary | ICD-10-CM | POA: Diagnosis present

## 2021-04-23 DIAGNOSIS — K254 Chronic or unspecified gastric ulcer with hemorrhage: Secondary | ICD-10-CM | POA: Diagnosis not present

## 2021-04-23 DIAGNOSIS — K253 Acute gastric ulcer without hemorrhage or perforation: Secondary | ICD-10-CM | POA: Diagnosis not present

## 2021-04-23 DIAGNOSIS — D62 Acute posthemorrhagic anemia: Secondary | ICD-10-CM | POA: Diagnosis present

## 2021-04-23 DIAGNOSIS — Z8672 Personal history of thrombophlebitis: Secondary | ICD-10-CM | POA: Diagnosis not present

## 2021-04-23 DIAGNOSIS — E871 Hypo-osmolality and hyponatremia: Secondary | ICD-10-CM | POA: Diagnosis present

## 2021-04-23 DIAGNOSIS — R2689 Other abnormalities of gait and mobility: Secondary | ICD-10-CM | POA: Diagnosis present

## 2021-04-23 DIAGNOSIS — R262 Difficulty in walking, not elsewhere classified: Secondary | ICD-10-CM | POA: Diagnosis present

## 2021-04-23 DIAGNOSIS — R55 Syncope and collapse: Secondary | ICD-10-CM | POA: Diagnosis present

## 2021-04-23 DIAGNOSIS — E876 Hypokalemia: Secondary | ICD-10-CM | POA: Diagnosis not present

## 2021-04-23 DIAGNOSIS — Z96651 Presence of right artificial knee joint: Secondary | ICD-10-CM

## 2021-04-23 DIAGNOSIS — D72829 Elevated white blood cell count, unspecified: Secondary | ICD-10-CM | POA: Diagnosis present

## 2021-04-23 DIAGNOSIS — K3189 Other diseases of stomach and duodenum: Secondary | ICD-10-CM | POA: Diagnosis not present

## 2021-04-23 LAB — CBC WITH DIFFERENTIAL/PLATELET
Abs Immature Granulocytes: 0.21 10*3/uL — ABNORMAL HIGH (ref 0.00–0.07)
Basophils Absolute: 0 10*3/uL (ref 0.0–0.1)
Basophils Relative: 0 %
Eosinophils Absolute: 0 10*3/uL (ref 0.0–0.5)
Eosinophils Relative: 0 %
HCT: 20.3 % — ABNORMAL LOW (ref 39.0–52.0)
Hemoglobin: 7 g/dL — ABNORMAL LOW (ref 13.0–17.0)
Immature Granulocytes: 2 %
Lymphocytes Relative: 15 %
Lymphs Abs: 2.1 10*3/uL (ref 0.7–4.0)
MCH: 35.4 pg — ABNORMAL HIGH (ref 26.0–34.0)
MCHC: 34.5 g/dL (ref 30.0–36.0)
MCV: 102.5 fL — ABNORMAL HIGH (ref 80.0–100.0)
Monocytes Absolute: 2.3 10*3/uL — ABNORMAL HIGH (ref 0.1–1.0)
Monocytes Relative: 16 %
Neutro Abs: 9.7 10*3/uL — ABNORMAL HIGH (ref 1.7–7.7)
Neutrophils Relative %: 67 %
Platelets: 200 10*3/uL (ref 150–400)
RBC: 1.98 MIL/uL — ABNORMAL LOW (ref 4.22–5.81)
RDW: 12.7 % (ref 11.5–15.5)
WBC: 14.4 10*3/uL — ABNORMAL HIGH (ref 4.0–10.5)
nRBC: 0.2 % (ref 0.0–0.2)

## 2021-04-23 LAB — RESP PANEL BY RT-PCR (FLU A&B, COVID) ARPGX2
Influenza A by PCR: NEGATIVE
Influenza B by PCR: NEGATIVE
SARS Coronavirus 2 by RT PCR: NEGATIVE

## 2021-04-23 LAB — COMPREHENSIVE METABOLIC PANEL
ALT: 19 U/L (ref 0–44)
AST: 30 U/L (ref 15–41)
Albumin: 2.5 g/dL — ABNORMAL LOW (ref 3.5–5.0)
Alkaline Phosphatase: 39 U/L (ref 38–126)
Anion gap: 20 — ABNORMAL HIGH (ref 5–15)
BUN: 42 mg/dL — ABNORMAL HIGH (ref 8–23)
CO2: 16 mmol/L — ABNORMAL LOW (ref 22–32)
Calcium: 7.3 mg/dL — ABNORMAL LOW (ref 8.9–10.3)
Chloride: 95 mmol/L — ABNORMAL LOW (ref 98–111)
Creatinine, Ser: 0.97 mg/dL (ref 0.61–1.24)
GFR, Estimated: >60 mL/min (ref >60)
Glucose, Bld: 251 mg/dL — ABNORMAL HIGH (ref 70–99)
Potassium: 2.9 mmol/L — ABNORMAL LOW (ref 3.5–5.1)
Sodium: 131 mmol/L — ABNORMAL LOW (ref 135–145)
Total Bilirubin: 0.8 mg/dL (ref 0.3–1.2)
Total Protein: 5.2 g/dL — ABNORMAL LOW (ref 6.5–8.1)

## 2021-04-23 LAB — PROTIME-INR
INR: 1.8 — ABNORMAL HIGH (ref 0.8–1.2)
Prothrombin Time: 20.5 seconds — ABNORMAL HIGH (ref 11.4–15.2)

## 2021-04-23 LAB — CBG MONITORING, ED: Glucose-Capillary: 237 mg/dL — ABNORMAL HIGH (ref 70–99)

## 2021-04-23 LAB — PREPARE RBC (CROSSMATCH)

## 2021-04-23 LAB — MAGNESIUM: Magnesium: 2.8 mg/dL — ABNORMAL HIGH (ref 1.7–2.4)

## 2021-04-23 LAB — POC OCCULT BLOOD, ED: Fecal Occult Bld: POSITIVE — AB

## 2021-04-23 MED ORDER — SODIUM CHLORIDE 0.9 % IV SOLN: INTRAVENOUS | Status: DC

## 2021-04-23 MED ORDER — SODIUM CHLORIDE 0.9 % IV BOLUS
1000.0000 mL | Freq: Once | INTRAVENOUS | Status: AC
  Administered 2021-04-23: 1000 mL via INTRAVENOUS

## 2021-04-23 MED ORDER — SODIUM CHLORIDE 0.9 % IV SOLN
10.0000 mL/h | Freq: Once | INTRAVENOUS | Status: AC
  Administered 2021-04-24: 10 mL/h via INTRAVENOUS

## 2021-04-23 MED ORDER — PANTOPRAZOLE 80MG IVPB - SIMPLE MED
80.0000 mg | Freq: Once | INTRAVENOUS | Status: AC
  Administered 2021-04-23: 80 mg via INTRAVENOUS
  Filled 2021-04-23: qty 80

## 2021-04-23 MED ORDER — LORAZEPAM 2 MG/ML IJ SOLN: 1.0000 mg | Freq: Once | INTRAMUSCULAR | Status: DC

## 2021-04-23 MED ORDER — POTASSIUM CHLORIDE 10 MEQ/100ML IV SOLN
10.0000 meq | Freq: Once | INTRAVENOUS | Status: AC
  Administered 2021-04-23: 10 meq via INTRAVENOUS
  Filled 2021-04-23: qty 100

## 2021-04-23 MED ORDER — PANTOPRAZOLE INFUSION (NEW) - SIMPLE MED
8.0000 mg/h | INTRAVENOUS | Status: DC
  Administered 2021-04-23: 8 mg/h via INTRAVENOUS
  Filled 2021-04-23: qty 80
  Filled 2021-04-23: qty 100

## 2021-04-23 NOTE — ED Provider Notes (Signed)
[facility] DEPT Provider Note   CSN: [id number] Arrival date & time: 04/23/21  2158     History No chief complaint on file.   Joe Rivera is a 75 y.o. male.  Pt presents to the ED today with a syncopal event.  Pt had his right knee replaced by Dr. Alvan Dame on 10/25.  He was d/c yesterday.  He has been feeling weak and dizzy all day today.  He noticed some black diarrhea as well.  He passed out on his way to the bathroom tonight.  Pt said he took 1 dose of Xarelto at home today for DVT prophylaxis.  He also feels sob.      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  Social History   Tobacco Use   Smoking status: Never   Smokeless tobacco: Never  Vaping Use   Vaping Use: Never used  Substance Use Topics   Alcohol use: Yes    Alcohol/week: 7.0 standard drinks    Types: 7 Cans of beer per week    Comment: Case of beer a week   Drug use: No    Home Medications Prior to Admission  medications   Medication Sig Start Date End Date Taking? Authorizing Provider  acetaminophen (TYLENOL) 325 MG tablet Take 3 tablets (975 mg total) by mouth every 8 (eight) hours. 04/22/21   Irving Copas, PA-C  allopurinol (ZYLOPRIM) 300 MG tablet Take 300 mg by mouth daily as needed. For gout      [provider]  amLODipine (NORVASC) 5 MG tablet Take 5 mg by mouth daily.    [provider]  Artificial Tear Ointment (DRY EYES OP) Place 1 drop into both eyes daily.    [provider]  atorvastatin (LIPITOR) 20 MG tablet Take 20 mg by mouth at bedtime.    [provider]  celecoxib (CELEBREX) 200 MG capsule Take 1 capsule (200 mg total) by mouth daily. Begin this after you finish Xarelto 04/22/21   Irving Copas, PA-C  cloNIDine (CATAPRES) 0.1 MG tablet Take 0.1 mg by mouth 2 (two) times daily.    [provider]  diphenhydrAMINE (BENADRYL) 50 MG tablet Take 50 mg by mouth at bedtime.    [provider]  docusate sodium (COLACE) 100 MG capsule Take 1 capsule (100 mg total) by mouth 2 (two) times daily.  04/22/21   Irving Copas, PA-C  indapamide (LOZOL) 2.5 MG tablet Take 2.5 mg by mouth daily.    [provider]  metFORMIN (GLUCOPHAGE) 500 MG tablet Take 1,000 mg by mouth 2 (two) times daily with a meal.    [provider]  methocarbamol (ROBAXIN) 500 MG tablet Take 1 tablet (500 mg total) by mouth every 6 (six) hours as needed for muscle spasms. 04/22/21   Irving Copas, PA-C  metoprolol tartrate (LOPRESSOR) 50 MG tablet Take 50 mg by mouth 2 (two) times daily.    [provider]  oxyCODONE (OXY IR/ROXICODONE) 5 MG immediate release tablet Take 1-2 tablets (5-10 mg total) by mouth every 4 (four) hours as needed for severe pain. 04/22/21   Irving Copas, PA-C  polyethylene glycol (MIRALAX / GLYCOLAX) 17 g packet Take 17 g by mouth daily as needed for mild constipation. 04/22/21   Irving Copas,  PA-C  rivaroxaban (XARELTO) 10 MG TABS tablet Take 1 tablet (10 mg total) by mouth daily with breakfast for 14 days. Followed by aspirin 81 mg twice daily for another month  (For DVT prevention) 04/22/21 05/06/21  Irving Copas, PA-C    Allergies    Lisinopril  Review of Systems   Review of Systems  Gastrointestinal:        Black stool  Neurological:  Positive for syncope and weakness.  All other systems reviewed and are negative.  Physical Exam Updated Vital Signs BP (!) 99/59   Pulse 100   Temp (!) 96.8 F (36 C) (Rectal)   Resp 20   SpO2 100%   Physical Exam Vitals and nursing note reviewed.  Constitutional:      Appearance: Normal appearance.  HENT:     Head: Normocephalic and atraumatic.     Right Ear: External ear normal.     Left Ear: External ear normal.     Nose: Nose normal.     Mouth/Throat:     Mouth: Mucous membranes are moist.     Pharynx: Oropharynx is clear.  Eyes:     Extraocular Movements: Extraocular movements intact.     Conjunctiva/sclera: Conjunctivae normal.     Pupils: Pupils are equal, round, and reactive to light.  Cardiovascular:     Rate and Rhythm: Regular rhythm. Tachycardia present.     Pulses: Normal pulses.     Heart sounds: Normal heart sounds.  Pulmonary:     Effort: Tachypnea present.     Breath sounds: Normal breath sounds.  Abdominal:     General: Abdomen is flat. Bowel sounds are normal.     Palpations: Abdomen is soft.  Genitourinary:    Rectum: Guaiac result positive.     Comments: Black, tarry stool Musculoskeletal:     Cervical back: Normal range of motion and neck supple.     Comments: Right knee with bandage.  Mild swelling.  No redness or pus drainage.  Skin:    General: Skin is warm.     Capillary Refill: Capillary refill takes less than 2 seconds.  Neurological:     General: No focal deficit present.     Mental Status: He is alert and oriented to person, place, and time.  Psychiatric:        Mood and  Affect: Mood normal.        Behavior: Behavior normal.    ED Results / Procedures / Treatments   Labs (all labs ordered are listed, but only abnormal results are displayed)  Labs Reviewed  CBC WITH DIFFERENTIAL/PLATELET - Abnormal; Notable for the following components:      Result Value   WBC 14.4 (*)    RBC 1.98 (*)    Hemoglobin 7.0 (*)    HCT 20.3 (*)    MCV 102.5 (*)    MCH 35.4 (*)    Neutro Abs 9.7 (*)    Monocytes Absolute 2.3 (*)    Abs Immature Granulocytes 0.21 (*)    All other components within normal limits  COMPREHENSIVE METABOLIC PANEL - Abnormal; Notable for the following components:   Sodium 131 (*)    Potassium 2.9 (*)    Chloride 95 (*)    CO2 16 (*)    Glucose, Bld 251 (*)    BUN 42 (*)    Calcium 7.3 (*)    Total Protein 5.2 (*)    Albumin 2.5 (*)    Anion gap 20 (*)    All other components within normal limits  PROTIME-INR - Abnormal; Notable for the following components:   Prothrombin Time 20.5 (*)    INR 1.8 (*)    All other components within normal limits  MAGNESIUM - Abnormal; Notable for the following components:   Magnesium 2.8 (*)    All other components within normal limits  CBG MONITORING, ED - Abnormal; Notable for the following components:   Glucose-Capillary 237 (*)    All other components within normal limits  POC OCCULT BLOOD, ED - Abnormal; Notable for the following components:   Fecal Occult Bld POSITIVE (*)    All other components within normal limits  RESP PANEL BY RT-PCR (FLU A&B, COVID) ARPGX2  URINALYSIS, ROUTINE W REFLEX MICROSCOPIC  TYPE AND SCREEN  PREPARE RBC (CROSSMATCH)    EKG EKG Interpretation  Date/Time:  Thursday April 23 2021 22:10:45 EDT Ventricular Rate:  97 PR Interval:  170 QRS Duration: 101 QT Interval:  381 QTC Calculation: 484 R Axis:   33 Text Interpretation: Sinus rhythm Abnormal R-wave progression, early transition Borderline repolarization abnormality Borderline prolonged QT interval No  significant change since last tracing Confirmed by Isla Pence [phone]) on 04/23/2021 10:24:50 PM  Radiology DG Chest Port 1 View  Result Date: 04/23/2021 CLINICAL DATA:  Shortness of breath. EXAM: PORTABLE CHEST 1 VIEW COMPARISON:  Chest x-ray 05/04/2011. FINDINGS: Patient is rotated. Cardiomediastinal silhouette is within normal limits for degree of rotation. The lungs and costophrenic angles are clear. There is no pneumothorax or acute fracture. IMPRESSION: No active disease. Electronically Signed   By: Ronney Asters M.D.   On: 04/23/2021 22:32    Procedures Procedures   Medications Ordered in ED Medications  sodium chloride 0.9 % bolus 1,000 mL (1,000 mLs Intravenous New Bag/Given 04/23/21 2236)    And  0.9 %  sodium chloride infusion ( Intravenous New Bag/Given 04/23/21 2326)  pantoprozole (PROTONIX) 80 mg /NS 100 mL infusion (8 mg/hr Intravenous New Bag/Given 04/23/21 2255)  potassium chloride 10 mEq in 100 mL IVPB (10 mEq Intravenous New Bag/Given 04/23/21 2324)  0.9 %  sodium chloride infusion (has no administration in time range)  pantoprazole (PROTONIX) 80 mg /NS 100 mL IVPB (0 mg Intravenous Stopped 04/23/21 2322)    ED Course  I have reviewed the triage vital signs and the nursing notes.  Pertinent labs & imaging results that were available during my care of the patient were reviewed by me and considered in my medical decision making (see chart for details).    MDM Rules/Calculators/A&P  Pt's syncopal event is likely due to his acute GI bleed.  Hgb 12.7 yesterday.  Today, it is 7.  Pt started on Protonix bolus and drip.  2 units of blood ordered for transfusion.  Secure chat message sent to Dr. Alessandra Bevels [facility]).  K is also low at 2.9.  This is replaced.  Pt d/w Dr. Nevada Crane ([facility]) for admission.  CRITICAL CARE Performed by: Isla Pence   Total critical care time: 30 minutes  Critical care time was exclusive of separately  billable procedures and treating other patients.  Critical care was necessary to treat or prevent imminent or life-threatening deterioration.  Critical care was time spent personally by me on the following activities: development of treatment plan with patient and/or surrogate as well as nursing, discussions with consultants, evaluation of patient's response to treatment, examination of patient, obtaining history from patient or surrogate, ordering and performing treatments and interventions, ordering and review of laboratory studies, ordering and review of radiographic studies, pulse oximetry and re-evaluation of patient's condition.  Final Clinical Impression(s) / ED Diagnoses Final diagnoses:  Acute upper GI bleed  Symptomatic anemia  On rivaroxaban therapy  Hypokalemia    Rx / DC Orders ED Discharge Orders     None        Isla Pence, MD 04/23/21 2333

## 2021-04-23 NOTE — ED Triage Notes (Signed)
[facility] from home. Pt had knee replaced Tuesday. Pt has been feeling dizzy and weak today. Had syncopal episode lasting 3 minutes today on the way to the bathroom. Pt reports black diarrhea.

## 2021-04-23 NOTE — ED Notes (Signed)
Pt. CBG 237, RN, Leonides Sake made aware.

## 2021-04-23 NOTE — ED Notes (Signed)
Pt. Made aware for the need of urine specimen. 

## 2021-04-24 ENCOUNTER — Encounter (HOSPITAL_COMMUNITY): Payer: Self-pay | Admitting: Internal Medicine

## 2021-04-24 DIAGNOSIS — E871 Hypo-osmolality and hyponatremia: Secondary | ICD-10-CM

## 2021-04-24 DIAGNOSIS — E119 Type 2 diabetes mellitus without complications: Secondary | ICD-10-CM

## 2021-04-24 DIAGNOSIS — D649 Anemia, unspecified: Secondary | ICD-10-CM

## 2021-04-24 DIAGNOSIS — D62 Acute posthemorrhagic anemia: Secondary | ICD-10-CM

## 2021-04-24 DIAGNOSIS — E785 Hyperlipidemia, unspecified: Secondary | ICD-10-CM

## 2021-04-24 DIAGNOSIS — D72829 Elevated white blood cell count, unspecified: Secondary | ICD-10-CM

## 2021-04-24 DIAGNOSIS — E876 Hypokalemia: Secondary | ICD-10-CM

## 2021-04-24 DIAGNOSIS — K921 Melena: Secondary | ICD-10-CM

## 2021-04-24 DIAGNOSIS — K922 Gastrointestinal hemorrhage, unspecified: Secondary | ICD-10-CM | POA: Diagnosis present

## 2021-04-24 DIAGNOSIS — R262 Difficulty in walking, not elsewhere classified: Secondary | ICD-10-CM

## 2021-04-24 DIAGNOSIS — Z96651 Presence of right artificial knee joint: Secondary | ICD-10-CM

## 2021-04-24 LAB — URINALYSIS, ROUTINE W REFLEX MICROSCOPIC
Bacteria, UA: NONE SEEN
Bilirubin Urine: NEGATIVE
Glucose, UA: 150 mg/dL — AB
Ketones, ur: 20 mg/dL — AB
Leukocytes,Ua: NEGATIVE
Nitrite: NEGATIVE
Protein, ur: NEGATIVE mg/dL
Specific Gravity, Urine: 1.015 (ref 1.005–1.030)
pH: 6 (ref 5.0–8.0)

## 2021-04-24 LAB — CBC
HCT: 27 % — ABNORMAL LOW (ref 39.0–52.0)
Hemoglobin: 9.6 g/dL — ABNORMAL LOW (ref 13.0–17.0)
MCH: 33.3 pg (ref 26.0–34.0)
MCHC: 35.6 g/dL (ref 30.0–36.0)
MCV: 93.8 fL (ref 80.0–100.0)
Platelets: 179 K/uL (ref 150–400)
RBC: 2.88 MIL/uL — ABNORMAL LOW (ref 4.22–5.81)
RDW: 15 % (ref 11.5–15.5)
WBC: 16 K/uL — ABNORMAL HIGH (ref 4.0–10.5)
nRBC: 0.6 % — ABNORMAL HIGH (ref 0.0–0.2)

## 2021-04-24 LAB — COMPREHENSIVE METABOLIC PANEL
ALT: 23 U/L (ref 0–44)
AST: 31 U/L (ref 15–41)
Albumin: 2.9 g/dL — ABNORMAL LOW (ref 3.5–5.0)
Alkaline Phosphatase: 40 U/L (ref 38–126)
Anion gap: 18 — ABNORMAL HIGH (ref 5–15)
BUN: 47 mg/dL — ABNORMAL HIGH (ref 8–23)
CO2: 18 mmol/L — ABNORMAL LOW (ref 22–32)
Calcium: 7.3 mg/dL — ABNORMAL LOW (ref 8.9–10.3)
Chloride: 98 mmol/L (ref 98–111)
Creatinine, Ser: 0.97 mg/dL (ref 0.61–1.24)
GFR, Estimated: >60 mL/min (ref >60)
Glucose, Bld: 231 mg/dL — ABNORMAL HIGH (ref 70–99)
Potassium: 3.1 mmol/L — ABNORMAL LOW (ref 3.5–5.1)
Sodium: 134 mmol/L — ABNORMAL LOW (ref 135–145)
Total Bilirubin: 1.2 mg/dL (ref 0.3–1.2)
Total Protein: 5.7 g/dL — ABNORMAL LOW (ref 6.5–8.1)

## 2021-04-24 LAB — CBG MONITORING, ED
Glucose-Capillary: 255 mg/dL — ABNORMAL HIGH (ref 70–99)
Glucose-Capillary: 232 mg/dL — ABNORMAL HIGH (ref 70–99)
Glucose-Capillary: 226 mg/dL — ABNORMAL HIGH (ref 70–99)
Glucose-Capillary: 207 mg/dL — ABNORMAL HIGH (ref 70–99)
Glucose-Capillary: 225 mg/dL — ABNORMAL HIGH (ref 70–99)
Glucose-Capillary: 251 mg/dL — ABNORMAL HIGH (ref 70–99)
Glucose-Capillary: 207 mg/dL — ABNORMAL HIGH (ref 70–99)
Glucose-Capillary: 201 mg/dL — ABNORMAL HIGH (ref 70–99)

## 2021-04-24 LAB — MAGNESIUM: Magnesium: 2.5 mg/dL — ABNORMAL HIGH (ref 1.7–2.4)

## 2021-04-24 LAB — HEMOGLOBIN AND HEMATOCRIT, BLOOD
HCT: 25.5 % — ABNORMAL LOW (ref 39.0–52.0)
HCT: 23.5 % — ABNORMAL LOW (ref 39.0–52.0)
Hemoglobin: 9.2 g/dL — ABNORMAL LOW (ref 13.0–17.0)
Hemoglobin: 8.3 g/dL — ABNORMAL LOW (ref 13.0–17.0)

## 2021-04-24 LAB — PHOSPHORUS: Phosphorus: 2.1 mg/dL — ABNORMAL LOW (ref 2.5–4.6)

## 2021-04-24 MED ORDER — INSULIN ASPART 100 UNIT/ML IJ SOLN
0.0000 [IU] | INTRAMUSCULAR | Status: DC
  Administered 2021-04-24: 5 [IU] via SUBCUTANEOUS
  Filled 2021-04-24: qty 0.09

## 2021-04-24 MED ORDER — INSULIN GLARGINE-YFGN 100 UNIT/ML ~~LOC~~ SOLN
5.0000 [IU] | Freq: Every day | SUBCUTANEOUS | Status: DC
  Administered 2021-04-24 – 2021-04-25 (×2): 5 [IU] via SUBCUTANEOUS
  Filled 2021-04-24 (×3): qty 0.05

## 2021-04-24 MED ORDER — OXYCODONE HCL 5 MG PO TABS
5.0000 mg | ORAL_TABLET | Freq: Four times a day (QID) | ORAL | Status: DC | PRN
  Administered 2021-04-24 – 2021-04-26 (×6): 5 mg via ORAL
  Filled 2021-04-24 (×7): qty 1

## 2021-04-24 MED ORDER — ACETAMINOPHEN 325 MG PO TABS
650.0000 mg | ORAL_TABLET | Freq: Four times a day (QID) | ORAL | Status: DC | PRN
  Administered 2021-04-26: 650 mg via ORAL
  Filled 2021-04-24: qty 2

## 2021-04-24 MED ORDER — METOPROLOL TARTRATE 25 MG PO TABS: 12.5000 mg | ORAL_TABLET | Freq: Two times a day (BID) | ORAL | Status: DC

## 2021-04-24 MED ORDER — SODIUM CHLORIDE 0.9 % IV SOLN: INTRAVENOUS | Status: DC

## 2021-04-24 MED ORDER — CALCIUM CARBONATE 1250 (500 CA) MG PO TABS
500.0000 mg | ORAL_TABLET | Freq: Three times a day (TID) | ORAL | Status: AC
  Administered 2021-04-24 (×3): 500 mg via ORAL
  Filled 2021-04-24 (×3): qty 1

## 2021-04-24 MED ORDER — SODIUM BICARBONATE 650 MG PO TABS
1300.0000 mg | ORAL_TABLET | Freq: Three times a day (TID) | ORAL | Status: AC
  Administered 2021-04-24 (×3): 1300 mg via ORAL
  Filled 2021-04-24 (×3): qty 2

## 2021-04-24 MED ORDER — POTASSIUM CHLORIDE IN NACL 40-0.9 MEQ/L-% IV SOLN
INTRAVENOUS | Status: AC
Start: 2021-04-24 — End: 2021-04-26
  Filled 2021-04-24 (×6): qty 1000

## 2021-04-24 MED ORDER — ATORVASTATIN CALCIUM 10 MG PO TABS
20.0000 mg | ORAL_TABLET | Freq: Every day | ORAL | Status: DC
  Administered 2021-04-24 – 2021-04-27 (×4): 20 mg via ORAL
  Filled 2021-04-24 (×4): qty 2

## 2021-04-24 MED ORDER — INSULIN ASPART 100 UNIT/ML IJ SOLN
0.0000 [IU] | INTRAMUSCULAR | Status: DC
Start: 2021-04-24 — End: 2021-04-25
  Administered 2021-04-24 (×2): 7 [IU] via SUBCUTANEOUS
  Administered 2021-04-24: 11 [IU] via SUBCUTANEOUS
  Administered 2021-04-24 (×2): 5 [IU] via SUBCUTANEOUS
  Administered 2021-04-24: 7 [IU] via SUBCUTANEOUS
  Administered 2021-04-25 (×2): 4 [IU] via SUBCUTANEOUS
  Filled 2021-04-24: qty 0.2

## 2021-04-24 MED ORDER — POTASSIUM CHLORIDE 20 MEQ PO PACK
40.0000 meq | PACK | ORAL | Status: AC
Start: 2021-04-24 — End: 2021-04-24
  Administered 2021-04-24 (×2): 40 meq via ORAL
  Filled 2021-04-24 (×2): qty 2

## 2021-04-24 MED ORDER — MELATONIN 3 MG PO TABS: 3.0000 mg | ORAL_TABLET | Freq: Every evening | ORAL | Status: DC | PRN

## 2021-04-24 MED ORDER — POLYETHYLENE GLYCOL 3350 17 G PO PACK: 17.0000 g | PACK | Freq: Every day | ORAL | Status: DC | PRN

## 2021-04-24 MED ORDER — ONDANSETRON HCL 4 MG/2ML IJ SOLN: 4.0000 mg | Freq: Four times a day (QID) | INTRAMUSCULAR | Status: DC | PRN

## 2021-04-24 MED ORDER — K PHOS MONO-SOD PHOS DI & MONO 155-852-130 MG PO TABS
250.0000 mg | ORAL_TABLET | Freq: Two times a day (BID) | ORAL | Status: DC
  Administered 2021-04-24 (×2): 250 mg via ORAL
  Filled 2021-04-24 (×3): qty 1

## 2021-04-24 NOTE — Progress Notes (Signed)
PROGRESS NOTE    Joe Rivera  GMW:[id number] DOB: [birth year] DOA: 04/23/2021 PCP: [facility]    No chief complaint on file.   Brief Narrative:  Patient 75 year old gentleman history of recent right total knee replacement 04/21/2021 per orthopedics, discharged home on Xarelto for DVT prophylaxis presenting to the ED with generalized weakness dizziness, shortness of breath on minimal exertion, black tarry stools.  Patient noted to have a hemoglobin of 7 from 12.5 on day of discharge on presentation to the ED.  Patient admitted due to concern for upper GI bleed.  GI consulted.  Xarelto discontinued.  Orthopedics notified of admission and consulted.   Assessment & Plan:   Principal Problem:   Upper GI bleed Active Problems:   GI bleed   Symptomatic anemia   Acute blood loss anemia   Hypokalemia   Hypomagnesemia   Hyponatremia   Type 2 diabetes mellitus without complication (HCC)   S/P TKR (total knee replacement), right   Leukocytosis   Hyperlipidemia   Hypocalcemia   Ambulatory dysfunction  #1 probable upper GI bleed/acute blood loss anemia/symptomatic anemia -Patient presented with symptomatic anemia after recent total right knee replacement discharged on 04/22/2021 on Xarelto for anticoagulation.  DOAC was started after discharge. -Hemoglobin on presentation was 7.0 from 12.5 on day prior to discharge. -Patient noted to be taking ibuprofen 800 mg daily for several months prior to knee replacement. -Status post transfusion 2 units packed red blood cells with posttransfusion H&H at 9.6. -Patient still with ongoing black tarry/melanotic stools this morning. -Symptomatic improvement posttransfusion. -Serial H&H. -Continue Protonix drip. -Keep NPO. -Transfusion threshold hemoglobin < 7.  -GI consulted and patient for probable upper endoscopy for further evaluation and management.  2.  Hypokalemia/hypophosphatemia -KCl packet 40 mEq every 4 hours x2  doses. -K-Phos to 50 p.o. twice daily. -Magnesium noted at 2.5. -Follow.  3.  Leukocytosis -Likely reactive leukocytosis. -Patient afebrile. -Urinalysis unremarkable. -Chest x-ray no acute infiltrate. -Monitor off antibiotics.  4.  Anion gap metabolic acidosis -Serum bicarb noted at 16 with anion gap of 20 on admission. -Patient on IV fluids. -Patient given bicarb tablets 1300 mg 3 times daily x3 doses. -Follow.  5.  Hypovolemic hyponatremia -Improving with hydration.  6.  Recent total right knee replacement on 04/21/2021 per Dr. Alvan Dame -Continue to hold Xarelto pending evaluation of problem #1. -Place SCDs for DVT prophylaxis. -Pain management. -Orthopedics notified of admission.  7.  Hypertension -BP noted to be soft on admission. -Continue to hold home oral antihypertensive medications.  8.  Diabetes mellitus type 2 -Hemoglobin A1c 6.5 (04/10/2021) -CBG 226 this morning. -Continue to hold oral hypoglycemic agents -May need to start low-dose Semglee at 5 units daily. -CBG every 4 hours.  9.  Hypocalcemia -Continue calcium carbonate.  10.  Hyperlipidemia -Statin.  11.  Ambulatory dysfunction -PT/OT. -Fall precautions.   DVT prophylaxis: SCDs Code Status: Full Family Communication: Updated patient and daughter at bedside. Disposition:   Status is: Inpatient  Remains inpatient appropriate because: Inpatient treatment appropriate for this patient patient presented with significant anemia with sudden drop in hemoglobin, requiring transfusion of packed red blood cells and evaluation for GI for possible EGD.     Consultants:  Gastroenterology pending  Procedures:  Chest x-ray 04/23/2021  Antimicrobials:  None   Subjective: Laying in bed.  States shortness of breath is improved posttransfusion.  No chest pain.  No abdominal pain.  Still endorses melanotic stools this morning.  Does state he was taking 800 mg of ibuprofen  daily for several months.  Wife  at bedside.  Objective: Vitals:   04/24/21 0620 04/24/21 0933 04/24/21 1125 04/24/21 1430  BP: 138/87  (!) 138/96 (!) 143/77  Pulse: (!) 111  (!) 107 (!) 101  Resp: 18  18 18   Temp: 97.8 F (36.6 C)     TempSrc: Oral     SpO2: 100%  100% 100%  Weight:  115.7 kg    Height:  5' 10.5" (1.791 m)      Intake/Output Summary (Last 24 hours) at 04/24/2021 1454 Last data filed at 04/24/2021 0336 Gross per 24 hour  Intake 650 ml  Output --  Net 650 ml   Filed Weights   04/24/21 0933  Weight: 115.7 kg    Examination:  General exam: Appears calm and comfortable  Respiratory system: Clear to auscultation. Respiratory effort normal. Cardiovascular system: S1 & S2 heard, RRR. No JVD, murmurs, rubs, gallops or clicks. No pedal edema. Gastrointestinal system: Abdomen is nondistended, soft and nontender. No organomegaly or masses felt. Normal bowel sounds heard. Central nervous system: Alert and oriented. No focal neurological deficits. Extremities: Right lower extremity with some knee swelling, no erythema, nontender to palpation.   Skin: No rashes, lesions or ulcers Psychiatry: Judgement and insight appear normal. Mood & affect appropriate.     Data Reviewed: I have personally reviewed following labs and imaging studies  CBC: Recent Labs  Lab 04/22/21 0334 04/23/21 2222 04/24/21 0500 04/24/21 1100  WBC 11.4* 14.4* 16.0*  --   NEUTROABS  --  9.7*  --   --   HGB 12.5* 7.0* 9.6* 9.2*  HCT 35.1* 20.3* 27.0* 25.5*  MCV 98.6 102.5* 93.8  --   PLT 142* 200 179  --     Basic Metabolic Panel: Recent Labs  Lab 04/22/21 0334 04/23/21 2222 04/24/21 0500  NA 130* 131* 134*  K 3.6 2.9* 3.1*  CL 94* 95* 98  CO2 29 16* 18*  GLUCOSE 269* 251* 231*  BUN 14 42* 47*  CREATININE 0.81 0.97 0.97  CALCIUM 8.1* 7.3* 7.3*  MG  --  2.8* 2.5*  PHOS  --   --  2.1*    GFR: Estimated Creatinine Clearance: 87.1 mL/min (by C-G formula based on SCr of 0.97 mg/dL).  Liver Function  Tests: Recent Labs  Lab 04/23/21 2222 04/24/21 0500  AST 30 31  ALT 19 23  ALKPHOS 39 40  BILITOT 0.8 1.2  PROT 5.2* 5.7*  ALBUMIN 2.5* 2.9*    CBG: Recent Labs  Lab 04/23/21 2220 04/24/21 0233 04/24/21 0350 04/24/21 0755 04/24/21 1112  GLUCAP 237* 255* 232* 226* 207*     Recent Results (from the past 240 hour(s))  SARS Coronavirus 2 (TAT 6-24 hrs)     Status: None   Collection Time: 04/17/21 12:00 AM  Result Value Ref Range Status   SARS Coronavirus 2 RESULT: NEGATIVE  Final    Comment: RESULT: NEGATIVESARS-CoV-2 INTERPRETATION:A NEGATIVE  test result means that SARS-CoV-2 RNA was not present in the specimen above the limit of detection of this test. This does not preclude a possible SARS-CoV-2 infection and should not be used as the  sole basis for patient management decisions. Negative results must be combined with clinical observations, patient history, and epidemiological information. Optimum specimen types and timing for peak viral levels during infections caused by SARS-CoV-2  have not been determined. Collection of multiple specimens or types of specimens may be necessary to detect virus. Improper specimen collection and handling, sequence variability  under primers/probes, or organism present below the limit of detection may  lead to false negative results. Positive and negative predictive values of testing are highly dependent on prevalence. False negative test results are more likely when prevalence of disease is high.The expected result is NEGATIVE.Fact S heet for  Healthcare Providers: LocalChronicle.no Sheet for Patients: SalonLookup.es Reference Range - Negative   Resp Panel by RT-PCR (Flu A&B, Covid) Nasopharyngeal Swab     Status: None   Collection Time: 04/23/21 10:22 PM   Specimen: Nasopharyngeal Swab; Nasopharyngeal(NP) swabs in vial transport medium  Result Value Ref Range Status   SARS  Coronavirus 2 by RT PCR NEGATIVE NEGATIVE Final    Comment: (NOTE) SARS-CoV-2 target nucleic acids are NOT DETECTED.  The SARS-CoV-2 RNA is generally detectable in upper respiratory specimens during the acute phase of infection. The lowest concentration of SARS-CoV-2 viral copies this assay can detect is 138 copies/mL. A negative result does not preclude SARS-Cov-2 infection and should not be used as the sole basis for treatment or other patient management decisions. A negative result may occur with  improper specimen collection/handling, submission of specimen other than nasopharyngeal swab, presence of viral mutation(s) within the areas targeted by this assay, and inadequate number of viral copies(<138 copies/mL). A negative result must be combined with clinical observations, patient history, and epidemiological information. The expected result is Negative.  Fact Sheet for Patients:  EntrepreneurPulse.com.au  Fact Sheet for Healthcare Providers:  IncredibleEmployment.be  This test is no t yet approved or cleared by the [place] FDA and  has been authorized for detection and/or diagnosis of SARS-CoV-2 by FDA under an Emergency Use Authorization (EUA). This EUA will remain  in effect (meaning this test can be used) for the duration of the COVID-19 declaration under Section 564(b)(1) of the Act, 21 U.S.C.section 360bbb-3(b)(1), unless the authorization is terminated  or revoked sooner.       Influenza A by PCR NEGATIVE NEGATIVE Final   Influenza B by PCR NEGATIVE NEGATIVE Final    Comment: (NOTE) The Xpert Xpress SARS-CoV-2/FLU/RSV plus assay is intended as an aid in the diagnosis of influenza from Nasopharyngeal swab specimens and should not be used as a sole basis for treatment. Nasal washings and aspirates are unacceptable for Xpert Xpress SARS-CoV-2/FLU/RSV testing.  Fact Sheet for  Patients: EntrepreneurPulse.com.au  Fact Sheet for Healthcare Providers: IncredibleEmployment.be  This test is not yet approved or cleared by the [place] FDA and has been authorized for detection and/or diagnosis of SARS-CoV-2 by FDA under an Emergency Use Authorization (EUA). This EUA will remain in effect (meaning this test can be used) for the duration of the COVID-19 declaration under Section 564(b)(1) of the Act, 21 U.S.C. section 360bbb-3(b)(1), unless the authorization is terminated or revoked.  Performed at [facility], [place] [address], [place], [place] [address]          Radiology Studies: [facility] Chest Port 1 View  Result Date: 04/23/2021 CLINICAL DATA:  Shortness of breath. EXAM: PORTABLE CHEST 1 VIEW COMPARISON:  Chest x-ray 05/04/2011. FINDINGS: Patient is rotated. Cardiomediastinal silhouette is within normal limits for degree of rotation. The lungs and costophrenic angles are clear. There is no pneumothorax or acute fracture. IMPRESSION: No active disease. Electronically Signed   By: Ronney Asters M.D.   On: 04/23/2021 22:32        Scheduled Meds:  atorvastatin  20 mg Oral QHS   calcium carbonate  500 mg of elemental calcium Oral TID WC  insulin aspart  0-20 Units Subcutaneous Q4H   phosphorus  250 mg Oral BID   sodium bicarbonate  1,300 mg Oral TID   Continuous Infusions:  0.9 % NaCl with KCl 40 mEq / L 75 mL/hr at 04/24/21 0313   pantoprazole Stopped (04/24/21 0930)     LOS: 1 day    Time spent: 40 minutes  No charge.    Irine Seal, MD [facility]   To contact the attending provider between 7A-7P or the covering provider during after hours 7P-7A, please log into the web site [email or web address] and access using universal [place] password for that web site. If you do not have the password, please call the hospital operator.  04/24/2021, 2:54 PM

## 2021-04-24 NOTE — ED Notes (Addendum)
Blood transfusion stopped at 0619. Morning labs can be drawn at 0819.

## 2021-04-24 NOTE — Evaluation (Signed)
Physical Therapy Evaluation Patient Details Name: Joe Rivera MRN: [id number] DOB: 1945/10/02 Today's Date: 04/24/2021  History of Present Illness  Patient is 75 y.o. male s/p Rt TKA on 10/25/2 with PMH significant for OA, DM, gout, HTN, anxiety, DVT, ACDF.  Pt readmitted 04/23/21 following syncopal episode at home and with HGB 7.0 - pt dx with GIB.  Pt recieved 2 units RBC and now HGB 9.2  Clinical Impression  Pt admitted as above and presenting with functional mobility limitations 2* decreased R LE strength/ROM, post op pain and dizziness with OOB activity.  Pt should progress to dc home with family assist and follow up with OP PT as initially planned post op - pt's first OP PT appt was to be this date.     Recommendations for follow up therapy are one component of a multi-disciplinary discharge planning process, led by the attending physician.  Recommendations may be updated based on patient status, additional functional criteria and insurance authorization.  Follow Up Recommendations Outpatient PT    Assistance Recommended at Discharge Intermittent Supervision/Assistance  Functional Status Assessment Patient has had a recent decline in their functional status and demonstrates the ability to make significant improvements in function in a reasonable and predictable amount of time.  Equipment Recommendations  None recommended by PT    Recommendations for Other Services       Precautions / Restrictions Precautions Precautions: Knee;Fall Restrictions Weight Bearing Restrictions: No Other Position/Activity Restrictions: WBAT      Mobility  Bed Mobility Overal bed mobility: Needs Assistance Bed Mobility: Supine to Sit     Supine to sit: Min guard     General bed mobility comments: use of bed rail and min guard for safety with c/o mild dizziness    Transfers Overall transfer level: Needs assistance Equipment used: Rolling walker (2 wheels) Transfers: Sit to/from  Stand Sit to Stand: Min assist           General transfer comment: cues for hand placement and RLE position    Ambulation/Gait Ambulation/Gait assistance: Min assist;Min guard Gait Distance (Feet): 100 Feet Assistive device: Rolling walker (2 wheels) Gait Pattern/deviations: Step-to pattern;Decreased step length - right;Decreased step length - left;Decreased stance time - right;Shuffle;Trunk flexed Gait velocity: decr   General Gait Details: cues for sequence, posture and position from [employer] Mobility    Modified Rankin (Stroke Patients Only)       Balance Overall balance assessment: Needs assistance Sitting-balance support: Feet supported Sitting balance-Leahy Scale: Good     Standing balance support: Reliant on assistive device for balance;During functional activity;Bilateral upper extremity supported Standing balance-Leahy Scale: Poor                               Pertinent Vitals/Pain Pain Assessment: 0-10 Pain Score: 5  Pain Location: Rt knee Pain Descriptors / Indicators: Discomfort Pain Intervention(s): Limited activity within patient's tolerance;Monitored during session    Home Living Family/patient expects to be discharged to:: Private residence Living Arrangements: Spouse/significant other Available Help at Discharge: Family Type of Home: House Home Access: Stairs to enter Entrance Stairs-Rails: None Entrance Stairs-Number of Steps: 1 Alternate Level Stairs-Number of Steps: 14 Home Layout: Two level;Bed/bath upstairs;1/2 bath on main level Home Equipment: Cane - single point;Rolling Walker (2 wheels)      Prior Function Prior Level of Function : Independent/Modified Independent  Hand Dominance   Dominant Hand: Right    Extremity/Trunk Assessment   Upper Extremity Assessment Upper Extremity Assessment: Overall WFL for tasks assessed    Lower Extremity  Assessment Lower Extremity Assessment: RLE deficits/detail RLE Deficits / Details: good quad activation, AAROM at knee -8 - 60    Cervical / Trunk Assessment Cervical / Trunk Assessment: Normal  Communication   Communication: No difficulties  Cognition Arousal/Alertness: Awake/alert Behavior During Therapy: WFL for tasks assessed/performed Overall Cognitive Status: Within Functional Limits for tasks assessed                                          General Comments      Exercises Total Joint Exercises Ankle Circles/Pumps: AROM;Both;20 reps;Seated Quad Sets: AROM;Both;10 reps Heel Slides: AROM;Right;15 reps;Supine Straight Leg Raises: AROM;Right;15 reps;Supine   Assessment/Plan    PT Assessment Patient needs continued PT services  PT Problem List Decreased strength;Decreased range of motion;Decreased activity tolerance;Decreased balance;Decreased mobility;Decreased knowledge of use of DME;Decreased knowledge of precautions;Pain       PT Treatment Interventions DME instruction;Gait training;Stair training;Functional mobility training;Therapeutic activities;Therapeutic exercise;Balance training;Patient/family education    PT Goals (Current goals can be found in the Care Plan section)  Acute Rehab PT Goals Patient Stated Goal: get back independence PT Goal Formulation: With patient Time For Goal Achievement: 04/28/21 Potential to Achieve Goals: Good    Frequency 7X/week   Barriers to discharge        Co-evaluation               AM-PAC PT "6 Clicks" Mobility  Outcome Measure Help needed turning from your back to your side while in a flat bed without using bedrails?: A Little Help needed moving from lying on your back to sitting on the side of a flat bed without using bedrails?: A Little Help needed moving to and from a bed to a chair (including a wheelchair)?: A Little Help needed standing up from a chair using your arms (e.g., wheelchair or  bedside chair)?: A Little Help needed to walk in hospital room?: A Little Help needed climbing 3-5 steps with a railing? : A Little 6 Click Score: 18    End of Session Equipment Utilized During Treatment: Gait belt Activity Tolerance: Patient tolerated treatment well Patient left: in chair;with call bell/phone within reach;with chair alarm set Nurse Communication: Mobility status PT Visit Diagnosis: Muscle weakness (generalized) (M62.81);Difficulty in walking, not elsewhere classified (R26.2)    Time: 9381-8299 PT Time Calculation (min) (ACUTE ONLY): 35 min   Charges:   PT Evaluation $PT Eval Low Complexity: 1 Low PT Treatments $Gait Training: 8-22 mins        Debe Coder PT Acute Rehabilitation Services Pager [phone] Office [phone]   Laya Letendre 04/24/2021, 5:03 PM

## 2021-04-24 NOTE — Consult Note (Signed)
Referring Provider: Dr. Irene Pap Primary Care Physician:  [facility], [facility] Primary Gastroenterologist:  [facility]  Reason for Consultation:  Symptomatic anemia  HPI: Joe Rivera is a 75 y.o. male with history of hypertension, hyperlipidemia, type 2 diabetes resents for consultation of symptomatic anemia with syncope recent right TKR on 04/21/2021.  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 Ihor Gully, discharged 04/22/2021 placed on Xarelto for DVT prophylaxis, last dose was morning of 04/23/21.  Patient presented to the ER 04/23/2021 with weakness, dizziness, dyspnea, palpitations acute onset with black tarry stools. Patient states the morning of 04/23/2021, patient had large volume dark black tarry stools, with prompt symptoms of anemia afterwards.  Patient has had 2 more bowel movements here in the ER 1 AM and 5 AM with black stools. Patient denies history of reflux.  Patient did have decreased appetite after knee replacement. Patient denies dysphagia, nausea, vomiting, AB pain. Patient is never had an episode like this prior. Was having constipation after knee replacement, took milk of magnesia. Was prescribed Celebrex but has not started it. No NSAIDs 2 weeks prior to knee replacement however months prior to this patient taking 800 mg once daily. Patient states he has drink beer 6-8 beers daily since 1973. Denies smoking history. Patient denies family history of GI malignancies.  Patient had a drop in hemoglobin from 7.0 K from 12.5 K on the day of discharge(04/22/2021).   Sarted on Protonix drip in the ED and has had 2 units PRBC, CBC this AM 9.6  Patient reports previous colonoscopy approximately 8 years ago, removed 1 benign polyp.  Records unavailable to review. Denies family history of colon cancer. Negative fit test 06/07/2019.  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[place])    1991 or 1992   Diabetes mellitus without complication ([place])    Gout    history of, takes allopurinol as needed   Hypertension     Past Surgical History:  Procedure Laterality Date   ANTERIOR CERVICAL DECOMP/DISCECTOMY FUSION  05/06/2011   Procedure: ANTERIOR CERVICAL DECOMPRESSION/DISCECTOMY FUSION 1 LEVEL;  Surgeon: Sinclair Ship;  Location: [facility];  Service: Orthopedics;  Laterality: Right;  ANTERIOR CERVICAL DECOMPRESSION AN FUSION C4-7 WITH INSTRUMENTATION RIGHT   HERNIA REPAIR     TESTICULAR EXPLORATION     TONSILLECTOMY     age 49 years old   WISDOM TOOTH EXTRACTION      Prior to Admission medications   Medication Sig Start Date End Date Taking? Authorizing Provider  acetaminophen (TYLENOL) 325 MG tablet Take 3 tablets (975 mg total) by mouth every 8 (eight) hours. 04/22/21  Yes Irving Copas, PA-C  amLODipine (NORVASC) 5 MG tablet Take 5 mg by mouth daily.   Yes [provider]  Artificial Tear Ointment (DRY EYES OP) Place 1 drop into both eyes daily.   Yes [provider]  cloNIDine (CATAPRES) 0.1 MG tablet Take 0.1 mg by mouth 2 (two) times daily.   Yes [provider]  diphenhydrAMINE (BENADRYL) 50 MG tablet Take 50 mg by mouth at bedtime as needed for sleep.   Yes [provider]  docusate sodium (COLACE) 100 MG capsule Take 1 capsule (100 mg total) by mouth 2 (two) times daily. 04/22/21  Yes Irving Copas, PA-C  indapamide (LOZOL) 2.5 MG tablet Take 2.5 mg by mouth daily.   Yes [provider]  metFORMIN (GLUCOPHAGE) 500 MG tablet Take 1,000 mg by mouth at bedtime.   Yes [provider]  methocarbamol (ROBAXIN) 500 MG tablet Take 1 tablet (500 mg total) by mouth every 6 (six) hours as needed for muscle spasms. 04/22/21  Yes Irving Copas, PA-C  metoprolol tartrate (LOPRESSOR) 50 MG tablet Take 50 mg by mouth 2 (two) times  daily.   Yes [provider]  rivaroxaban (XARELTO) 10 MG TABS tablet Take 1 tablet (10 mg total) by mouth daily with breakfast for 14 days. Followed by aspirin 81 mg twice daily for another month  (For DVT prevention) 04/22/21 05/06/21 Yes Irving Copas, PA-C  allopurinol (ZYLOPRIM) 300 MG tablet Take 300 mg by mouth daily as needed. For gout   Patient not taking: Reported on 04/24/2021    [provider]  atorvastatin (LIPITOR) 20 MG tablet Take 20 mg by mouth at bedtime.    [provider]  celecoxib (CELEBREX) 200 MG capsule Take 1 capsule (200 mg total) by mouth daily. Begin this after you finish Xarelto 04/22/21   Irving Copas, PA-C  oxyCODONE (OXY IR/ROXICODONE) 5 MG immediate release tablet Take 1-2 tablets (5-10 mg total) by mouth every 4 (four) hours as needed for severe pain. 04/22/21   Irving Copas, PA-C  polyethylene glycol (MIRALAX / GLYCOLAX) 17 g packet Take 17 g by mouth daily as needed for mild constipation. 04/22/21   Irving Copas, PA-C    Scheduled Meds:  atorvastatin  20 mg Oral QHS   calcium carbonate  500 mg of elemental calcium Oral TID WC   insulin aspart  0-20 Units Subcutaneous Q4H   sodium bicarbonate  1,300 mg Oral TID   Continuous Infusions:  0.9 % NaCl with KCl 40 mEq / L 75 mL/hr at 04/24/21 0313   pantoprazole 8 mg/hr (04/23/21 2255)   PRN Meds:.acetaminophen, melatonin, ondansetron (ZOFRAN) IV, oxyCODONE, polyethylene glycol  Allergies as of 04/23/2021 - Review Complete 04/23/2021  Allergen Reaction Noted   Lisinopril Swelling 04/06/2021    History reviewed. No pertinent family history.  Social History   Socioeconomic History   Marital status: Married    Spouse name: Not on file   Number of children: Not on file   Years of education: Not on file   Highest education level: Not on file  Occupational History   Not on file  Tobacco Use   Smoking status: Never   Smokeless tobacco: Never  Vaping Use    Vaping Use: Never used  Substance and Sexual Activity   Alcohol use: Yes    Alcohol/week: 7.0 standard drinks    Types: 7 Cans of beer per week    Comment: Case of beer a week   Drug use: No   Sexual activity: Yes  Other Topics Concern   Not on file  Social History Narrative   Not on file   Social Determinants of Health   Financial Resource Strain: Not on file  Food Insecurity: Not on file  Transportation Needs: Not on file  Physical Activity: Not on file  Stress: Not on file  Social Connections: Not on file  Intimate Partner Violence: Not on file    Review of Systems:  Review of Systems  Constitutional:  Positive for malaise/fatigue. Negative for chills, fever and weight loss.  HENT:  Negative for hearing loss.   Respiratory:  Positive for  shortness of breath. Negative for cough and sputum production.   Cardiovascular:  Negative for chest pain. Leg swelling: right lower leg swelling from surgery. Gastrointestinal:  Positive for melena. Negative for abdominal pain, blood in stool, constipation, diarrhea, heartburn, nausea and vomiting.  Musculoskeletal:  Positive for joint pain (from recent TKR). Negative for falls.  Skin:  Negative for itching.  Neurological:  Positive for dizziness, loss of consciousness and weakness. Negative for focal weakness.  Psychiatric/Behavioral:  Negative for memory loss.     Physical Exam: Vital signs: Vitals:   04/24/21 0600 04/24/21 0620  BP: 132/79 138/87  Pulse: (!) 108 (!) 111  Resp:  18  Temp:  97.8 F (36.6 C)  SpO2: 100% 100%     General:   Alert, in NAD Heart:  Tachycardia, normal rhythm; no murmurs Pulm: Clear anteriorly; no wheezing Abdomen:  Soft, Obese AB, skin exam normal, Normal bowel sounds.  no  tenderness  on exam . Without guarding and Without rebound, without hepatomegaly. Extremities:  right knee with clean dressing, some distal mild edema, no erythema, non tender, left leg with non pitting edema.  Neurologic:   Alert and  oriented x4;  grossly normal neurologically. Psych:  Alert and cooperative. Normal mood and affect.  GI:  Lab Results: Recent Labs    04/22/21 0334 04/23/21 2222  WBC 11.4* 14.4*  HGB 12.5* 7.0*  HCT 35.1* 20.3*  PLT 142* 200   BMET Recent Labs    04/22/21 0334 04/23/21 2222  NA 130* 131*  K 3.6 2.9*  CL 94* 95*  CO2 29 16*  GLUCOSE 269* 251*  BUN 14 42*  CREATININE 0.81 0.97  CALCIUM 8.1* 7.3*   LFT Recent Labs    04/23/21 2222  PROT 5.2*  ALBUMIN 2.5*  AST 30  ALT 19  ALKPHOS 39  BILITOT 0.8   PT/INR Recent Labs    04/23/21 2222  LABPROT 20.5*  INR 1.8*    Studies/Results: DG Chest Port 1 View  Result Date: 04/23/2021 CLINICAL DATA:  Shortness of breath. EXAM: PORTABLE CHEST 1 VIEW COMPARISON:  Chest x-ray 05/04/2011. FINDINGS: Patient is rotated. Cardiomediastinal silhouette is within normal limits for degree of rotation. The lungs and costophrenic angles are clear. There is no pneumothorax or acute fracture. IMPRESSION: No active disease. Electronically Signed   By: Ronney Asters M.D.   On: 04/23/2021 22:32    Impression and Plan Melena with recent initiation of Xarelto, in setting up chronic NSAID and ETOH use.  Likely UGI bleed with story and labs.  Patient is on blood thinners, started on 04/22/21 Louanna Raw for DVT prophylaxis, last dose 10/27 AM HGB 7.0 (12.5 04/22/2021) MCV 102.5 Platelets 200 BUN 42 (14 04/22/2021) Cr 0.97 GFR >60   Alcohol use - no evidence of cirrhosis/hepatomegaly on exam -Continue with EGD - discussed alcohol cessation with patient -May want to consider outpatient AB [place] INR 1.8  S/p right total knee replacement -DVT prophylaxis recommendations after EGD  Plan for EGD tomorrow. I thoroughly discussed the procedure to include nature, alternatives, benefits, and risks including but not limited to bleeding, perforation, infection, anesthesia/cardiac and pulmonary complications. Patient provides understanding  and gave verbal consent to proceed.   Protonix continuous drip until after EGD   Clear liquid diet, NPO at midnight.   Continue daily CBC with transfusion as needed to maintain Hgb >7.    [facility] will follow.    LOS: 1 day   Vladimir Crofts  PA-C 04/24/2021, 8:09 AM  Contact #  [phone]

## 2021-04-24 NOTE — H&P (Addendum)
History and Physical  Joe Rivera OAC:[id number] DOB: [birth year] DOA: 04/23/2021  Referring physician: Dr. Gilford Raid, [place]  PCP: [facility], [facility]  Outpatient Specialists: Orthopedic surgery Patient coming from: Home.  Chief Complaint: Passed out  HPI: Joe Rivera is a 75 y.o. male with medical history significant for recent right total knee replacement on 04/21/2021 by orthopedic surgery, Dr. Alvan Dame, admitted by the orthopedic service and discharged on 04/22/2021 on Xarelto for DVT prophylaxis.  He was doing fine until today.  Reports generalized weakness and dizziness all day since about noon.  Associated with dyspnea with minimal exertion, palpitations and 1 episode of black tarry stools.  He had not had a bowel movement in 3 days so he took 2 doses of milk of magnesia and had a large bowel movement, stools were dark initially then brown towards the end.  No abdominal pain or nausea.  Prior to surgery he was taking ibuprofen which he stopped taking.  He went back to bed, felt like he was going to have another bowel movement, sat up in bed to go to the bathroom, then passed out.  EMS was activated.  He was brought to [facility] ED for further evaluation and management.  Work-up in the ED revealed drop in hemoglobin 7.0 K from 12.5 K on the day of discharge.  Due to concern for upper GI bleed patient was started on Protonix drip in the ED.  2 units PRBCs were ordered to be transfused by EDP.  GI consulted.  Patient made n.p.o. after midnight for possible EGD in the morning.  Patient admitted by hospitalist service, [facility].  ED Course: Temperature 96.8.  BP 108/69, pulse 97, respiration rate 18, O2 saturation 100% on 4 L.  Lab studies remarkable for serum sodium 131, potassium 2.9, serum bicarb 16, glucose 251, BUN 42, creatinine 0.97, anion gap 20, magnesium 2.8, hemoglobin 7.0 from 12.5 on 04/22/2021.  MCV 102.5.  WBC 14.4.  Review of Systems: Review of systems as noted in the HPI. All other  systems reviewed and are negative.   Past Medical History:  Diagnosis Date   Anxiety    does not take medication for   Arthritis    Cervical radicular pain    has pain in right arm   Deep vein thrombophlebitis of right leg ([place])    1991 or 1992   Diabetes mellitus without complication ([place])    Gout    history of, takes allopurinol as needed   Hypertension    Past Surgical History:  Procedure Laterality Date   ANTERIOR CERVICAL DECOMP/DISCECTOMY FUSION  05/06/2011   Procedure: ANTERIOR CERVICAL DECOMPRESSION/DISCECTOMY FUSION 1 LEVEL;  Surgeon: Sinclair Ship;  Location: [place];  Service: Orthopedics;  Laterality: Right;  ANTERIOR CERVICAL DECOMPRESSION AN FUSION C4-7 WITH INSTRUMENTATION RIGHT   HERNIA REPAIR     TESTICULAR EXPLORATION     TONSILLECTOMY     age 44 years old   75 TOOTH EXTRACTION      Social History:  reports that he has never smoked. He has never used smokeless tobacco. He reports current alcohol use of about 7.0 standard drinks per week. He reports that he does not use drugs.   Allergies  Allergen Reactions   Lisinopril Swelling    Tongue    Family history: -Father with history of emphysema. -Mother with history of breast cancer with metastasis to the brain.     Prior to Admission medications   Medication Sig Start Date End Date Taking? Authorizing Provider  acetaminophen (  TYLENOL) 325 MG tablet Take 3 tablets (975 mg total) by mouth every 8 (eight) hours. 04/22/21   Irving Copas, PA-C  allopurinol (ZYLOPRIM) 300 MG tablet Take 300 mg by mouth daily as needed. For gout      [provider]  amLODipine (NORVASC) 5 MG tablet Take 5 mg by mouth daily.    [provider]  Artificial Tear Ointment (DRY EYES OP) Place 1 drop into both eyes daily.    [provider]  atorvastatin (LIPITOR) 20 MG tablet Take 20 mg by mouth at bedtime.    [provider]  celecoxib (CELEBREX) 200 MG capsule Take 1 capsule  (200 mg total) by mouth daily. Begin this after you finish Xarelto 04/22/21   Irving Copas, PA-C  cloNIDine (CATAPRES) 0.1 MG tablet Take 0.1 mg by mouth 2 (two) times daily.    [provider]  diphenhydrAMINE (BENADRYL) 50 MG tablet Take 50 mg by mouth at bedtime.    [provider]  docusate sodium (COLACE) 100 MG capsule Take 1 capsule (100 mg total) by mouth 2 (two) times daily. 04/22/21   Irving Copas, PA-C  indapamide (LOZOL) 2.5 MG tablet Take 2.5 mg by mouth daily.    [provider]  metFORMIN (GLUCOPHAGE) 500 MG tablet Take 1,000 mg by mouth 2 (two) times daily with a meal.    [provider]  methocarbamol (ROBAXIN) 500 MG tablet Take 1 tablet (500 mg total) by mouth every 6 (six) hours as needed for muscle spasms. 04/22/21   Irving Copas, PA-C  metoprolol tartrate (LOPRESSOR) 50 MG tablet Take 50 mg by mouth 2 (two) times daily.    [provider]  oxyCODONE (OXY IR/ROXICODONE) 5 MG immediate release tablet Take 1-2 tablets (5-10 mg total) by mouth every 4 (four) hours as needed for severe pain. 04/22/21   Irving Copas, PA-C  polyethylene glycol (MIRALAX / GLYCOLAX) 17 g packet Take 17 g by mouth daily as needed for mild constipation. 04/22/21   Irving Copas, PA-C  rivaroxaban (XARELTO) 10 MG TABS tablet Take 1 tablet (10 mg total) by mouth daily with breakfast for 14 days. Followed by aspirin 81 mg twice daily for another month  (For DVT prevention) 04/22/21 05/06/21  Irving Copas, PA-C    Physical Exam: BP (!) 99/59   Pulse 100   Temp (!) 96.8 F (36 C) (Rectal)   Resp 20   SpO2 100%   General: 75 y.o. year-old male well developed well nourished in no acute distress.  Alert and oriented x3. Cardiovascular: Regular rate and rhythm with no rubs or gallops.  No thyromegaly or JVD noted.  Right lower extremity is edematous.  2/4 pulses in all 4 extremities. Respiratory: Clear to auscultation with no wheezes  or rales. Good inspiratory effort. Abdomen: Soft obese, nontender nondistended with normal bowel sounds x4 quadrants. Muskuloskeletal: No cyanosis, clubbing.  Right lower extremity edematous.  Dorsalis pedis pulses palpable.  Skin is warm. Neuro: CN II-XII intact, strength, sensation, reflexes Skin: No ulcerative lesions noted or rashes Psychiatry: Judgement and insight appear normal. Mood is appropriate for condition and setting          Labs on Admission:  Basic Metabolic Panel: Recent Labs  Lab 04/22/21 0334 04/23/21 2222  NA 130* 131*  K 3.6 2.9*  CL 94* 95*  CO2 29 16*  GLUCOSE 269* 251*  BUN 14 42*  CREATININE 0.81 0.97  CALCIUM 8.1* 7.3*  MG  --  2.8*   Liver Function Tests: Recent Labs  Lab 04/23/21 2222  AST 30  ALT 19  ALKPHOS 39  BILITOT 0.8  PROT 5.2*  ALBUMIN 2.5*   No results for input(s): LIPASE, AMYLASE in the last 168 hours. No results for input(s): AMMONIA in the last 168 hours. CBC: Recent Labs  Lab 04/22/21 0334 04/23/21 2222  WBC 11.4* 14.4*  NEUTROABS  --  9.7*  HGB 12.5* 7.0*  HCT 35.1* 20.3*  MCV 98.6 102.5*  PLT 142* 200   Cardiac Enzymes: No results for input(s): CKTOTAL, CKMB, CKMBINDEX, TROPONINI in the last 168 hours.  BNP (last 3 results) No results for input(s): BNP in the last 8760 hours.  ProBNP (last 3 results) No results for input(s): PROBNP in the last 8760 hours.  CBG: Recent Labs  Lab 04/21/21 1703 04/21/21 2141 04/22/21 0740 04/22/21 1146 04/23/21 2220  GLUCAP 359* 281* 218* 215* 237*    Radiological Exams on Admission: DG Chest Port 1 View  Result Date: 04/23/2021 CLINICAL DATA:  Shortness of breath. EXAM: PORTABLE CHEST 1 VIEW COMPARISON:  Chest x-ray 05/04/2011. FINDINGS: Patient is rotated. Cardiomediastinal silhouette is within normal limits for degree of rotation. The lungs and costophrenic angles are clear. There is no pneumothorax or acute fracture. IMPRESSION: No active disease. Electronically  Signed   By: Ronney Asters M.D.   On: 04/23/2021 22:32    EKG: I independently viewed the EKG done and my findings are as followed: Sinus rhythm rate of 97.  Nonspecific ST-T changes.  QTc 484.  Assessment/Plan Present on Admission:  GI bleed  Active Problems:   GI bleed  Suspected upper GI bleed Recent right total knee replacement, discharged on 04/22/2021 on Xarelto for DVT prophylaxis.  Started [place] after discharge. Presented with hemoglobin of 7.0 K from 12.5 K the day prior to presentation. Prior to surgery was taking ibuprofen. 2 units PRBCs ordered to be transfused in the ED Repeat CBC posttransfusion Follow H&H every 6 hours. Maintain MAP greater than 65. Avoid NSAIDs  Anion gap metabolic acidosis Serum bicarb 16, anion gap 20 Continue IV fluid hydration Add po sodium bicarb 1300 mg TID x 3 doses. Monitor BMP in the morning.  Hypokalemia Serum potassium 2.8 Repleted intravenously Repeat BMP in the morning  Hypovolemic hyponatremia Corrected sodium 131 for hyperglycemia 251, calculated 133 Continue normal saline KCL 35meq IV fluid at 75 cc/h x 2 days Repeat BMP in the morning  Leukocytosis, suspect reactive in the setting of recent surgery and syncope WBC 14.4, not septic appearing Rule out other causes, UA is pending. Chest x-ray nonacute. 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 Alvan Dame Hold off Xarelto for now Consult orthopedic surgery in the morning Pain control  Essential hypertension BPs are soft Hold off home oral antihypertensives to avoid hypotension Maintain MAP greater than 65  Type 2 diabetes with hyperglycemia Obtain hemoglobin A1c Hold off home oral hypoglycemics Start insulin sliding scale every 4 hours while NPO.  Hypocalcemia Corrected calcium for albumin 8.5 Repleted with p.o. calcium carbonate 500 mg 3 times daily x3 doses.  Hyperlipidemia Resume home  Lipitor.  Ambulatory dysfunction PT OT with orthopedic surgery's guidance Fall precautions    DVT prophylaxis: SCDs  Code Status: Full code  Family Communication: Spouse at bedside  Disposition Plan: Admitted to progressive unit  Consults called: None; please consult orthopedic surgery in the morning  Admission status: Inpatient status.  Patient  will require at least 2 midnights for further evaluation and treatment of present condition.   Status is: Inpatient       Kayleen Memos MD [facility] Pager [phone]  If 7PM-7AM, please contact night-coverage [email or web address] Password [email or web address]  04/24/2021, 12:10 AM

## 2021-04-24 NOTE — ED Notes (Signed)
RN waiting to draw 5AM labs due to current blood transfusion.

## 2021-04-25 ENCOUNTER — Inpatient Hospital Stay (HOSPITAL_COMMUNITY): Payer: No Typology Code available for payment source | Admitting: Certified Registered"

## 2021-04-25 ENCOUNTER — Encounter (HOSPITAL_COMMUNITY)
Admission: EM | Disposition: A | Discharge status: Home / Self Care | Payer: Self-pay | Source: Home / Self Care | Attending: Internal Medicine

## 2021-04-25 ENCOUNTER — Encounter (HOSPITAL_COMMUNITY): Payer: Self-pay | Admitting: Internal Medicine

## 2021-04-25 HISTORY — PX: ESOPHAGOGASTRODUODENOSCOPY (EGD) WITH PROPOFOL: SHX5813

## 2021-04-25 HISTORY — PX: BIOPSY: SHX5522

## 2021-04-25 LAB — CBC WITH DIFFERENTIAL/PLATELET
Abs Immature Granulocytes: 0.06 10*3/uL (ref 0.00–0.07)
Abs Immature Granulocytes: 0.05 10*3/uL (ref 0.00–0.07)
Basophils Absolute: 0 10*3/uL (ref 0.0–0.1)
Basophils Absolute: 0 10*3/uL (ref 0.0–0.1)
Basophils Relative: 0 %
Basophils Relative: 0 %
Eosinophils Absolute: 0.1 10*3/uL (ref 0.0–0.5)
Eosinophils Absolute: 0.2 10*3/uL (ref 0.0–0.5)
Eosinophils Relative: 1 %
Eosinophils Relative: 2 %
HCT: 23.6 % — ABNORMAL LOW (ref 39.0–52.0)
HCT: 22 % — ABNORMAL LOW (ref 39.0–52.0)
Hemoglobin: 8.3 g/dL — ABNORMAL LOW (ref 13.0–17.0)
Hemoglobin: 7.5 g/dL — ABNORMAL LOW (ref 13.0–17.0)
Immature Granulocytes: 1 %
Immature Granulocytes: 1 %
Lymphocytes Relative: 14 %
Lymphocytes Relative: 15 %
Lymphs Abs: 1.4 10*3/uL (ref 0.7–4.0)
Lymphs Abs: 1.1 10*3/uL (ref 0.7–4.0)
MCH: 33.7 pg (ref 26.0–34.0)
MCH: 34.1 pg — ABNORMAL HIGH (ref 26.0–34.0)
MCHC: 35.2 g/dL (ref 30.0–36.0)
MCHC: 34.1 g/dL (ref 30.0–36.0)
MCV: 95.9 fL (ref 80.0–100.0)
MCV: 100 fL (ref 80.0–100.0)
Monocytes Absolute: 1.3 10*3/uL — ABNORMAL HIGH (ref 0.1–1.0)
Monocytes Absolute: 1 10*3/uL (ref 0.1–1.0)
Monocytes Relative: 13 %
Monocytes Relative: 14 %
Neutro Abs: 6.9 10*3/uL (ref 1.7–7.7)
Neutro Abs: 4.9 10*3/uL (ref 1.7–7.7)
Neutrophils Relative %: 71 %
Neutrophils Relative %: 68 %
Platelets: 143 10*3/uL — ABNORMAL LOW (ref 150–400)
Platelets: 135 10*3/uL — ABNORMAL LOW (ref 150–400)
RBC: 2.46 MIL/uL — ABNORMAL LOW (ref 4.22–5.81)
RBC: 2.2 MIL/uL — ABNORMAL LOW (ref 4.22–5.81)
RDW: 16 % — ABNORMAL HIGH (ref 11.5–15.5)
RDW: 16 % — ABNORMAL HIGH (ref 11.5–15.5)
WBC: 9.7 10*3/uL (ref 4.0–10.5)
WBC: 7.2 10*3/uL (ref 4.0–10.5)
nRBC: 0.8 % — ABNORMAL HIGH (ref 0.0–0.2)
nRBC: 1.1 % — ABNORMAL HIGH (ref 0.0–0.2)

## 2021-04-25 LAB — RENAL FUNCTION PANEL
Albumin: 3 g/dL — ABNORMAL LOW (ref 3.5–5.0)
Anion gap: 8 (ref 5–15)
BUN: 27 mg/dL — ABNORMAL HIGH (ref 8–23)
CO2: 26 mmol/L (ref 22–32)
Calcium: 7.3 mg/dL — ABNORMAL LOW (ref 8.9–10.3)
Chloride: 103 mmol/L (ref 98–111)
Creatinine, Ser: 0.65 mg/dL (ref 0.61–1.24)
GFR, Estimated: >60 mL/min (ref >60)
Glucose, Bld: 170 mg/dL — ABNORMAL HIGH (ref 70–99)
Phosphorus: 2 mg/dL — ABNORMAL LOW (ref 2.5–4.6)
Potassium: 3.1 mmol/L — ABNORMAL LOW (ref 3.5–5.1)
Sodium: 137 mmol/L (ref 135–145)

## 2021-04-25 LAB — CBG MONITORING, ED
Glucose-Capillary: 108 mg/dL — ABNORMAL HIGH (ref 70–99)
Glucose-Capillary: 189 mg/dL — ABNORMAL HIGH (ref 70–99)
Glucose-Capillary: 188 mg/dL — ABNORMAL HIGH (ref 70–99)
Glucose-Capillary: 178 mg/dL — ABNORMAL HIGH (ref 70–99)

## 2021-04-25 LAB — MAGNESIUM: Magnesium: 2.2 mg/dL (ref 1.7–2.4)

## 2021-04-25 LAB — GLUCOSE, CAPILLARY: Glucose-Capillary: 165 mg/dL — ABNORMAL HIGH (ref 70–99)

## 2021-04-25 SURGERY — ESOPHAGOGASTRODUODENOSCOPY (EGD) WITH PROPOFOL
Anesthesia: Monitor Anesthesia Care

## 2021-04-25 MED ORDER — PROPOFOL 500 MG/50ML IV EMUL
INTRAVENOUS | Status: DC | PRN
  Administered 2021-04-25: 100 ug/kg/min via INTRAVENOUS

## 2021-04-25 MED ORDER — AMLODIPINE BESYLATE 5 MG PO TABS
5.0000 mg | ORAL_TABLET | Freq: Every day | ORAL | Status: DC
  Administered 2021-04-26 – 2021-04-28 (×3): 5 mg via ORAL
  Filled 2021-04-25 (×3): qty 1

## 2021-04-25 MED ORDER — DEXMEDETOMIDINE (PRECEDEX) IN NS 20 MCG/5ML (4 MCG/ML) IV SYRINGE
PREFILLED_SYRINGE | INTRAVENOUS | Status: DC | PRN
  Administered 2021-04-25: 10 ug via INTRAVENOUS

## 2021-04-25 MED ORDER — PROPOFOL 10 MG/ML IV BOLUS
INTRAVENOUS | Status: DC | PRN
  Administered 2021-04-25: 60 mg via INTRAVENOUS

## 2021-04-25 MED ORDER — METHOCARBAMOL 500 MG PO TABS
500.0000 mg | ORAL_TABLET | Freq: Four times a day (QID) | ORAL | Status: DC | PRN
  Administered 2021-04-25 – 2021-04-26 (×2): 500 mg via ORAL
  Filled 2021-04-25 (×2): qty 1

## 2021-04-25 MED ORDER — LIDOCAINE 2% (20 MG/ML) 5 ML SYRINGE
INTRAMUSCULAR | Status: DC | PRN
  Administered 2021-04-25: 40 mg via INTRAVENOUS

## 2021-04-25 MED ORDER — K PHOS MONO-SOD PHOS DI & MONO 155-852-130 MG PO TABS
500.0000 mg | ORAL_TABLET | Freq: Two times a day (BID) | ORAL | Status: DC
  Administered 2021-04-25 – 2021-04-26 (×3): 500 mg via ORAL
  Filled 2021-04-25 (×5): qty 2

## 2021-04-25 MED ORDER — PANTOPRAZOLE SODIUM 40 MG PO TBEC
40.0000 mg | DELAYED_RELEASE_TABLET | Freq: Two times a day (BID) | ORAL | Status: DC
  Administered 2021-04-25 – 2021-04-28 (×6): 40 mg via ORAL
  Filled 2021-04-25 (×6): qty 1

## 2021-04-25 MED ORDER — POTASSIUM CHLORIDE 20 MEQ PO PACK
40.0000 meq | PACK | ORAL | Status: AC
Start: 2021-04-25 — End: 2021-04-25
  Administered 2021-04-25: 40 meq via ORAL
  Filled 2021-04-25: qty 2

## 2021-04-25 MED ORDER — INSULIN ASPART 100 UNIT/ML IJ SOLN
0.0000 [IU] | Freq: Three times a day (TID) | INTRAMUSCULAR | Status: DC
  Administered 2021-04-26: 3 [IU] via SUBCUTANEOUS
  Administered 2021-04-26: 4 [IU] via SUBCUTANEOUS
  Administered 2021-04-26 – 2021-04-27 (×2): 7 [IU] via SUBCUTANEOUS
  Administered 2021-04-27: 4 [IU] via SUBCUTANEOUS
  Administered 2021-04-27: 7 [IU] via SUBCUTANEOUS
  Administered 2021-04-28 (×2): 4 [IU] via SUBCUTANEOUS
  Filled 2021-04-25: qty 0.2

## 2021-04-25 MED ORDER — LACTATED RINGERS IV SOLN: INTRAVENOUS | Status: DC | PRN

## 2021-04-25 SURGICAL SUPPLY — 14 items

## 2021-04-25 NOTE — Progress Notes (Signed)
Joe Rivera 9:24 AM  Subjective: Patient with no bleeding today and no dark stool since midday yesterday and he has no other GI complaints and his history was reviewed and his case discussed with my partner Dr. Michail Sermon in his hospital computer chart reviewed  Objective: Vital signs stable afebrile exam please see preassessment evaluation BUN decreased hemoglobin stable white count decreased  Assessment: GI bleeding and patient on blood thinner previously nonsteroidals  Plan: We rediscussed endoscopy and will proceed with anesthesia assistance with further work-up and plans pending those findings  [facility] E  office [phone] After 5PM or if no answer call [phone]

## 2021-04-25 NOTE — Plan of Care (Signed)
  Problem: Education: Goal: Knowledge of General Education information will improve Description: Including pain rating scale, medication(s)/side effects and non-pharmacologic comfort measures 04/25/2021 1853 by Jenean Lindau, RN Outcome: Progressing 04/25/2021 1853 by Jenean Lindau, RN Outcome: Progressing   Problem: Health Behavior/Discharge Planning: Goal: Ability to manage health-related needs will improve 04/25/2021 1853 by Jenean Lindau, RN Outcome: Progressing 04/25/2021 1853 by Jenean Lindau, RN Outcome: Progressing   Problem: Clinical Measurements: Goal: Ability to maintain clinical measurements within normal limits will improve 04/25/2021 1853 by Jenean Lindau, RN Outcome: Progressing 04/25/2021 1853 by Jenean Lindau, RN Outcome: Progressing Goal: Will remain free from infection 04/25/2021 1853 by Jenean Lindau, RN Outcome: Progressing 04/25/2021 1853 by Jenean Lindau, RN Outcome: Progressing Goal: Diagnostic test results will improve 04/25/2021 1853 by Jenean Lindau, RN Outcome: Progressing 04/25/2021 1853 by Jenean Lindau, RN Outcome: Progressing Goal: Respiratory complications will improve 04/25/2021 1853 by Jenean Lindau, RN Outcome: Progressing 04/25/2021 1853 by Jenean Lindau, RN Outcome: Progressing Goal: Cardiovascular complication will be avoided 04/25/2021 1853 by Jenean Lindau, RN Outcome: Progressing 04/25/2021 1853 by Jenean Lindau, RN Outcome: Progressing   Problem: Activity: Goal: Risk for activity intolerance will decrease 04/25/2021 1853 by Jenean Lindau, RN Outcome: Progressing 04/25/2021 1853 by Jenean Lindau, RN Outcome: Progressing   Problem: Nutrition: Goal: Adequate nutrition will be maintained 04/25/2021 1853 by Jenean Lindau, RN Outcome: Progressing 04/25/2021 1853 by Jenean Lindau, RN Outcome: Progressing   Problem: Coping: Goal: Level  of anxiety will decrease 04/25/2021 1853 by Jenean Lindau, RN Outcome: Progressing 04/25/2021 1853 by Jenean Lindau, RN Outcome: Progressing   Problem: Elimination: Goal: Will not experience complications related to bowel motility 04/25/2021 1853 by Jenean Lindau, RN Outcome: Progressing 04/25/2021 1853 by Jenean Lindau, RN Outcome: Progressing Goal: Will not experience complications related to urinary retention 04/25/2021 1853 by Jenean Lindau, RN Outcome: Progressing 04/25/2021 1853 by Jenean Lindau, RN Outcome: Progressing   Problem: Pain Managment: Goal: General experience of comfort will improve 04/25/2021 1853 by Jenean Lindau, RN Outcome: Progressing 04/25/2021 1853 by Jenean Lindau, RN Outcome: Progressing   Problem: Safety: Goal: Ability to remain free from injury will improve 04/25/2021 1853 by Jenean Lindau, RN Outcome: Progressing 04/25/2021 1853 by Jenean Lindau, RN Outcome: Progressing   Problem: Skin Integrity: Goal: Risk for impaired skin integrity will decrease 04/25/2021 1853 by Jenean Lindau, RN Outcome: Progressing 04/25/2021 1853 by Jenean Lindau, RN Outcome: Progressing

## 2021-04-25 NOTE — ED Notes (Signed)
Re-collected CBC and sent to lab.

## 2021-04-25 NOTE — Anesthesia Postprocedure Evaluation (Signed)
Anesthesia Post Note  Patient: Joe Rivera  Procedure(s) Performed: ESOPHAGOGASTRODUODENOSCOPY (EGD) WITH PROPOFOL BIOPSY     Patient location during evaluation: Endoscopy Anesthesia Type: MAC Level of consciousness: awake and alert Pain management: pain level controlled Vital Signs Assessment: post-procedure vital signs reviewed and stable Respiratory status: spontaneous breathing, nonlabored ventilation, respiratory function stable and patient connected to nasal cannula oxygen Cardiovascular status: blood pressure returned to baseline and stable Postop Assessment: no apparent nausea or vomiting Anesthetic complications: no   No notable events documented.  Last Vitals:  Vitals:   04/25/21 1010 04/25/21 1020  BP: 121/65 (!) 148/61  Pulse: 96 83  Resp: 16 (!) 21  Temp:    SpO2: 95% 99%    Last Pain:  Vitals:   04/25/21 1020  TempSrc:   PainSc: 6                  Amillion Macchia L Erlin Gardella

## 2021-04-25 NOTE — Op Note (Signed)
[facility] Patient Name: Joe Rivera Procedure Date: 04/25/2021 MRN: [id number] Attending MD: Clarene Essex , MD Date of Birth: [birth year] CSN: [id number] Age: 75 Admit Type: Inpatient Procedure:                Upper GI endoscopy Indications:              Acute post hemorrhagic anemia, Melena Providers:                Clarene Essex, MD, Doristine Johns, RN, Jaci Carrel, RN, William Dalton, Technician Referring MD:              Medicines:                Monitored Anesthesia Care Complications:            No immediate complications. Estimated Blood Loss:     Estimated blood loss: none. Procedure:                Pre-Anesthesia Assessment:                           - Prior to the procedure, a History and Physical                            was performed, and patient medications and                            allergies were reviewed. The patient's tolerance of                            previous anesthesia was also reviewed. The risks                            and benefits of the procedure and the sedation                            options and risks were discussed with the patient.                            All questions were answered, and informed consent                            was obtained. Prior Anticoagulants: The patient has                            taken Xarelto (rivaroxaban), last dose was 1 day                            prior to procedure. ASA Grade Assessment: II - A                            patient with mild systemic disease. After reviewing  the risks and benefits, the patient was deemed in                            satisfactory condition to undergo the procedure.                           After obtaining informed consent, the endoscope was                            passed under direct vision. Throughout the                            procedure, the patient's blood pressure, pulse, and                             oxygen saturations were monitored continuously. The                            [id number]-[id number] ([id number]) Olympus endoscope was introduced                            through the mouth, and advanced to the third part                            of duodenum. The upper GI endoscopy was                            accomplished without difficulty. The patient                            tolerated the procedure well. Scope In: Scope Out: Findings:      The examined esophagus was normal.      Few non-bleeding cratered and linear gastric ulcers with no stigmata of       bleeding were found in the gastric antrum and in the prepyloric region       of the stomach.      The cardia, gastric fundus and gastric body were normal. Biopsies were       taken with a cold forceps for histology.      The duodenal bulb, first portion of the duodenum, second portion of the       duodenum and third portion of the duodenum were normal.      The exam was otherwise without abnormality. Impression:               - Normal esophagus.                           - Non-bleeding gastric ulcers with no stigmata of                            bleeding.                           - Normal cardia, gastric fundus and gastric body.  Biopsied.                           - Normal duodenal bulb, first portion of the                            duodenum, second portion of the duodenum and third                            portion of the duodenum.                           - The examination was otherwise normal. Moderate Sedation:      Not Applicable - Patient had care per Anesthesia. Recommendation:           - Soft diet today.                           - Continue present medications.                           - Use Protonix (pantoprazole) 40 mg PO BID for 1                            month. Then can decrease to once a day for 3 months                           - No aspirin, ibuprofen, naproxen, or  other                            non-steroidal anti-inflammatory drugs Long-term.                            Unless absolutely needed and then would continue                            pump inhibitor to protect from recurrent ulcers                           - Await pathology results. And treat H. pylori if                            positive                           - Return to GI clinic in 4 weeks. He can either                            follow-up with [place] in [place] or with his primary                            GI group in [place]                           - Telephone GI clinic for pathology results  in 5                            days.                           - Telephone GI clinic if symptomatic PRN. Subcu                            Lovenox or heparin would be okay with me and if                            absolutely needed could retry a blood thinner or                            antiplatelet agent in 5 days Procedure Code(s):        --- Professional ---                           [phone], Esophagogastroduodenoscopy, flexible,                            transoral; with biopsy, single or multiple Diagnosis Code(s):        --- Professional ---                           K25.9, Gastric ulcer, unspecified as acute or                            chronic, without hemorrhage or perforation                           D62, Acute posthemorrhagic anemia                           K92.1, Melena (includes Hematochezia) CPT copyright 2019 [employer]. All rights reserved. The codes documented in this report are preliminary and upon coder review may  be revised to meet current compliance requirements. Clarene Essex, MD 04/25/2021 10:22:41 AM This report has been signed electronically. Number of Addenda: 0

## 2021-04-25 NOTE — Progress Notes (Signed)
Physical Therapy Treatment Patient Details Name: Joe Rivera MRN: [id number] DOB: 10/23/1945 Today's Date: 04/25/2021   History of Present Illness Patient is 75 y.o. male s/p Rt TKA on 10/25/2 with PMH significant for OA, DM, gout, HTN, anxiety, DVT, ACDF.  Pt readmitted 04/23/21 following syncopal episode at home and with HGB 7.0 - pt dx with GIB.  Pt recieved 2 units RBC and now HGB 9.2    PT Comments    Pt in good spirits and agreeable to perform therex program.  Ambulation deferred at pt request 2* fatigue - noted Hgb last evening at 8.3 - drop from 9.2 following transfusion yesterday morning.  Pt reports for EGD today.  Recommendations for follow up therapy are one component of a multi-disciplinary discharge planning process, led by the attending physician.  Recommendations may be updated based on patient status, additional functional criteria and insurance authorization.  Follow Up Recommendations  Outpatient PT     Assistance Recommended at Discharge Intermittent Supervision/Assistance  Equipment Recommendations  None recommended by PT    Recommendations for Other Services       Precautions / Restrictions Precautions Precautions: Knee;Fall Restrictions Weight Bearing Restrictions: No Other Position/Activity Restrictions: WBAT     Mobility  Bed Mobility                    Transfers                        Ambulation/Gait                 Stairs             Wheelchair Mobility    Modified Rankin (Stroke Patients Only)       Balance                                            Cognition Arousal/Alertness: Awake/alert Behavior During Therapy: WFL for tasks assessed/performed Overall Cognitive Status: Within Functional Limits for tasks assessed                                          Exercises Total Joint Exercises Ankle Circles/Pumps: AROM;Both;20 reps;Seated Quad Sets: AROM;Both;10  reps Heel Slides: AROM;Right;Supine;20 reps Hip ABduction/ADduction: AROM;Right;10 reps Straight Leg Raises: Right;Supine;AAROM;AROM;20 reps Goniometric ROM: -5 - 80    General Comments        Pertinent Vitals/Pain Pain Assessment: 0-10 Pain Score: 6  Pain Location: Rt knee Pain Descriptors / Indicators: Discomfort Pain Intervention(s): Limited activity within patient's tolerance;Monitored during session (pt declines ice packs)    Home Living                          Prior Function            PT Goals (current goals can now be found in the care plan section) Acute Rehab PT Goals Patient Stated Goal: get back independence PT Goal Formulation: With patient Time For Goal Achievement: 04/28/21 Potential to Achieve Goals: Good Progress towards PT goals: Progressing toward goals    Frequency    7X/week      PT Plan Current plan remains appropriate    Co-evaluation  AM-PAC PT "6 Clicks" Mobility   Outcome Measure  Help needed turning from your back to your side while in a flat bed without using bedrails?: A Little Help needed moving from lying on your back to sitting on the side of a flat bed without using bedrails?: A Little Help needed moving to and from a bed to a chair (including a wheelchair)?: A Little Help needed standing up from a chair using your arms (e.g., wheelchair or bedside chair)?: A Little Help needed to walk in hospital room?: A Little Help needed climbing 3-5 steps with a railing? : A Little 6 Click Score: 18    End of Session Equipment Utilized During Treatment: Gait belt Activity Tolerance: Patient tolerated treatment well;Patient limited by fatigue Patient left: in chair;with call bell/phone within reach;with family/visitor present Nurse Communication: Mobility status PT Visit Diagnosis: Muscle weakness (generalized) (M62.81);Difficulty in walking, not elsewhere classified (R26.2)     Time: 2992-4268 PT Time  Calculation (min) (ACUTE ONLY): 17 min  Charges:  $Therapeutic Exercise: 8-22 mins                     [place] Pager [phone] Office [phone]    Deserai Cansler 04/25/2021, 8:27 AM

## 2021-04-25 NOTE — Progress Notes (Signed)
PROGRESS NOTE    Hensley Treat  IDP:[id number] DOB: 01-Feb-1946 DOA: 04/23/2021 PCP: [facility]    No chief complaint on file.   Brief Narrative:  Patient 75 year old gentleman history of recent right total knee replacement 04/21/2021 per orthopedics, discharged home on Xarelto for DVT prophylaxis presenting to the ED with generalized weakness dizziness, shortness of breath on minimal exertion, black tarry stools.  Patient noted to have a hemoglobin of 7 from 12.5 on day of discharge on presentation to the ED.  Patient admitted due to concern for upper GI bleed.  GI consulted.  Xarelto discontinued.  Orthopedics notified of admission and consulted.   Assessment & Plan:   Principal Problem:   Upper GI bleed Active Problems:   GI bleed   Symptomatic anemia   Acute blood loss anemia   Hypokalemia   Hypomagnesemia   Hyponatremia   Type 2 diabetes mellitus without complication (HCC)   S/P TKR (total knee replacement), right   Leukocytosis   Hyperlipidemia   Hypocalcemia   Ambulatory dysfunction  #1 probable upper GI bleed/acute blood loss anemia/symptomatic anemia -Patient presented with symptomatic anemia after recent total right knee replacement discharged on 04/22/2021 on Xarelto for anticoagulation.  DOAC was started after discharge. -Hemoglobin on presentation was 7.0 from 12.5 on day prior to discharge. -Patient noted to be taking ibuprofen 800 mg daily for several months prior to knee replacement. -Status post transfusion 2 units packed red blood cells with posttransfusion H&H at 9.6 initially. -Patient still with ongoing black tarry/melanotic stools with last event around 1 PM yesterday per patient.  Patient noted some bright red blood on the tissue yesterday afternoon.  -Symptomatic improvement.  -Hemoglobin down to 8.3 today.  -Repeat CBC this afternoon.  -Continue Protonix drip.  -Keep n.p.o. in anticipation of probable upper endoscopy this morning per GI  recommendations.  -Transfusion threshold hemoglobin < 7.  -GI consulted and following.   2.  Hypokalemia/hypophosphatemia -Potassium repleted yesterday however potassium still at 3.1 today.   -Phosphorus at 2.0.   -K-Phos 500mg  BID, KCl packet 40 mEq every 4 hours x2 doses. -Magnesium noted at 2.2. -Follow.  3.  Leukocytosis -Likely reactive leukocytosis. -Patient afebrile. -Urinalysis unremarkable. -Chest x-ray no acute infiltrate. -Leukocytosis trended down. -Monitor off antibiotics.  4.  Anion gap metabolic acidosis -Serum bicarb noted at 16 with anion gap of 20 on admission. -Patient on IV fluids. -Patient given bicarb tablets 1300 mg 3 times daily x3 doses. -Follow.  5.  Hypovolemic hyponatremia -Improving with hydration.  6.  Recent total right knee replacement on 04/21/2021 per Dr. Alvan Dame -Continue to hold Xarelto pending evaluation of problem #1. -SCDs for DVT prophylaxis.   -PT/OT.   -Pain management.   -Orthopedics aware of admission.    7.  Hypertension -BP noted to be soft on admission. -BP improved with hydration.   -Resume home regimen Norvasc. -Continue to hold clonidine.  8.  Diabetes mellitus type 2 -Hemoglobin A1c 6.5 (04/10/2021) -CBG 108 this morning.   -Continue Semglee 5 units daily, SSI.   -Continue to hold oral hypoglycemic agents.   -CBG every 4 hours.   9.  Hypocalcemia -Check a 25-hydroxy vitamin D level. -Place on calcium carbonate when tolerating a diet .    10.  Hyperlipidemia -Statin.  11.  Ambulatory dysfunction -PT/OT. -Fall precautions.   DVT prophylaxis: SCDs Code Status: Full Family Communication: Updated patient and wife at bedside. Disposition:   Status is: Inpatient  Remains inpatient appropriate because: Inpatient treatment appropriate for  this patient patient presented with significant anemia with sudden drop in hemoglobin, requiring transfusion of packed red blood cells and evaluation for GI for possible  EGD.     Consultants:  Gastroenterology: Dr. Michail Sermon 04/24/2021  Procedures:  Chest x-ray 04/23/2021  Antimicrobials:  None   Subjective: Patient sitting up in recliner.  Denies any chest pain.  No shortness of breath.  Stated last melanotic stool was around 1 PM yesterday afternoon.  Noted to have some bright red blood on tissue paper after wiping yesterday afternoon.  Overall feeling better.  Awaiting to get EGD this morning.     Objective: Vitals:   04/25/21 0500 04/25/21 0600 04/25/21 0700 04/25/21 0800  BP: (!) 153/81 (!) 144/77 (!) 144/80 (!) 142/79  Pulse: 81 85 92 93  Resp: 14 15 16 15   Temp:      TempSrc:      SpO2: 98% 96% 98% 98%  Weight:      Height:        Intake/Output Summary (Last 24 hours) at 04/25/2021 0902 Last data filed at 04/24/2021 1920 Gross per 24 hour  Intake 189.67 ml  Output --  Net 189.67 ml    Filed Weights   04/24/21 0933  Weight: 115.7 kg    Examination:  General exam: NAD Respiratory system: Lungs clear to auscultation bilaterally.  No wheezes, no crackles, no rhonchi.  Normal respiratory effort.   Cardiovascular system: Regular rate rhythm no murmurs rubs or gallops.  No JVD.  No lower extremity edema. Gastrointestinal system: Abdomen soft, nontender, nondistended, positive bowel sounds.  No rebound.  No guarding. Central nervous system: Alert and oriented. No focal neurological deficits. Extremities: Right lower extremity with some knee swelling, no erythema, nontender to palpation.   Skin: No rashes, lesions or ulcers Psychiatry: Judgement and insight appear normal. Mood & affect appropriate.     Data Reviewed: I have personally reviewed following labs and imaging studies  CBC: Recent Labs  Lab 04/22/21 0334 04/23/21 2222 04/24/21 0500 04/24/21 1100 04/24/21 1816  WBC 11.4* 14.4* 16.0*  --   --   NEUTROABS  --  9.7*  --   --   --   HGB 12.5* 7.0* 9.6* 9.2* 8.3*  HCT 35.1* 20.3* 27.0* 25.5* 23.5*  MCV 98.6  102.5* 93.8  --   --   PLT 142* 200 179  --   --      Basic Metabolic Panel: Recent Labs  Lab 04/22/21 0334 04/23/21 2222 04/24/21 0500 04/25/21 0301  NA 130* 131* 134* 137  K 3.6 2.9* 3.1* 3.1*  CL 94* 95* 98 103  CO2 29 16* 18* 26  GLUCOSE 269* 251* 231* 170*  BUN 14 42* 47* 27*  CREATININE 0.81 0.97 0.97 0.65  CALCIUM 8.1* 7.3* 7.3* 7.3*  MG  --  2.8* 2.5* 2.2  PHOS  --   --  2.1* 2.0*     GFR: Estimated Creatinine Clearance: 105.6 mL/min (by C-G formula based on SCr of 0.65 mg/dL).  Liver Function Tests: Recent Labs  Lab 04/23/21 2222 04/24/21 0500 04/25/21 0301  AST 30 31  --   ALT 19 23  --   ALKPHOS 39 40  --   BILITOT 0.8 1.2  --   PROT 5.2* 5.7*  --   ALBUMIN 2.5* 2.9* 3.0*     CBG: Recent Labs  Lab 04/24/21 1924 04/24/21 2207 04/24/21 2321 04/25/21 0249 04/25/21 0747  GLUCAP 251* 207* 201* 108* 189*  Recent Results (from the past 240 hour(s))  SARS Coronavirus 2 (TAT 6-24 hrs)     Status: None   Collection Time: 04/17/21 12:00 AM  Result Value Ref Range Status   SARS Coronavirus 2 RESULT: NEGATIVE  Final    Comment: RESULT: NEGATIVESARS-CoV-2 INTERPRETATION:A NEGATIVE  test result means that SARS-CoV-2 RNA was not present in the specimen above the limit of detection of this test. This does not preclude a possible SARS-CoV-2 infection and should not be used as the  sole basis for patient management decisions. Negative results must be combined with clinical observations, patient history, and epidemiological information. Optimum specimen types and timing for peak viral levels during infections caused by SARS-CoV-2  have not been determined. Collection of multiple specimens or types of specimens may be necessary to detect virus. Improper specimen collection and handling, sequence variability under primers/probes, or organism present below the limit of detection may  lead to false negative results. Positive and negative predictive values of  testing are highly dependent on prevalence. False negative test results are more likely when prevalence of disease is high.The expected result is NEGATIVE.Fact S heet for  Healthcare Providers: LocalChronicle.no Sheet for Patients: SalonLookup.es Reference Range - Negative   Resp Panel by RT-PCR (Flu A&B, Covid) Nasopharyngeal Swab     Status: None   Collection Time: 04/23/21 10:22 PM   Specimen: Nasopharyngeal Swab; Nasopharyngeal(NP) swabs in vial transport medium  Result Value Ref Range Status   SARS Coronavirus 2 by RT PCR NEGATIVE NEGATIVE Final    Comment: (NOTE) SARS-CoV-2 target nucleic acids are NOT DETECTED.  The SARS-CoV-2 RNA is generally detectable in upper respiratory specimens during the acute phase of infection. The lowest concentration of SARS-CoV-2 viral copies this assay can detect is 138 copies/mL. A negative result does not preclude SARS-Cov-2 infection and should not be used as the sole basis for treatment or other patient management decisions. A negative result may occur with  improper specimen collection/handling, submission of specimen other than nasopharyngeal swab, presence of viral mutation(s) within the areas targeted by this assay, and inadequate number of viral copies(<138 copies/mL). A negative result must be combined with clinical observations, patient history, and epidemiological information. The expected result is Negative.  Fact Sheet for Patients:  EntrepreneurPulse.com.au  Fact Sheet for Healthcare Providers:  IncredibleEmployment.be  This test is no t yet approved or cleared by the [place] FDA and  has been authorized for detection and/or diagnosis of SARS-CoV-2 by FDA under an Emergency Use Authorization (EUA). This EUA will remain  in effect (meaning this test can be used) for the duration of the COVID-19 declaration under Section 564(b)(1)  of the Act, 21 U.S.C.section 360bbb-3(b)(1), unless the authorization is terminated  or revoked sooner.       Influenza A by PCR NEGATIVE NEGATIVE Final   Influenza B by PCR NEGATIVE NEGATIVE Final    Comment: (NOTE) The Xpert Xpress SARS-CoV-2/FLU/RSV plus assay is intended as an aid in the diagnosis of influenza from Nasopharyngeal swab specimens and should not be used as a sole basis for treatment. Nasal washings and aspirates are unacceptable for Xpert Xpress SARS-CoV-2/FLU/RSV testing.  Fact Sheet for Patients: EntrepreneurPulse.com.au  Fact Sheet for Healthcare Providers: IncredibleEmployment.be  This test is not yet approved or cleared by the [place] FDA and has been authorized for detection and/or diagnosis of SARS-CoV-2 by FDA under an Emergency Use Authorization (EUA). This EUA will remain in effect (meaning this test can be used) for the duration of the  COVID-19 declaration under Section 564(b)(1) of the Act, 21 U.S.C. section 360bbb-3(b)(1), unless the authorization is terminated or revoked.  Performed at [facility], [address], [place], [place] [address]           Radiology Studies: [facility] Chest Port 1 View  Result Date: 04/23/2021 CLINICAL DATA:  Shortness of breath. EXAM: PORTABLE CHEST 1 VIEW COMPARISON:  Chest x-ray 05/04/2011. FINDINGS: Patient is rotated. Cardiomediastinal silhouette is within normal limits for degree of rotation. The lungs and costophrenic angles are clear. There is no pneumothorax or acute fracture. IMPRESSION: No active disease. Electronically Signed   By: Ronney Asters M.D.   On: 04/23/2021 22:32        Scheduled Meds:  atorvastatin  20 mg Oral QHS   insulin aspart  0-20 Units Subcutaneous Q4H   insulin glargine-yfgn  5 Units Subcutaneous QHS   phosphorus  500 mg Oral BID   potassium chloride  40 mEq Oral Q4H   Continuous Infusions:  sodium chloride Stopped  (04/24/21 1954)   0.9 % NaCl with KCl 40 mEq / L 75 mL/hr at 04/25/21 0339   pantoprazole Stopped (04/24/21 0930)     LOS: 2 days    Time spent: 40 minutes    Irine Seal, MD [facility]   To contact the attending provider between 7A-7P or the covering provider during after hours 7P-7A, please log into the web site [email or web address] and access using universal  password for that web site. If you do not have the password, please call the hospital operator.  04/25/2021, 9:02 AM

## 2021-04-25 NOTE — ED Notes (Signed)
Pt transported to endo.  

## 2021-04-25 NOTE — Progress Notes (Signed)
OT Cancellation Note  Patient Details Name: Joe Rivera MRN: [id number] DOB: [birth year]   Cancelled Treatment:    Reason Eval/Treat Not Completed: OT screened, no needs identified, will sign offPatient is at baseline for ADLs per discussion with PT on this date. Patient to transition home with wife support at time of d/c.  Jackelyn Poling OTR/L, [place] [facility] Department Office# [phone] Pager# [phone]   04/25/2021, 9:04 AM

## 2021-04-25 NOTE — Transfer of Care (Signed)
Immediate Anesthesia Transfer of Care Note  Patient: Joe Rivera  Procedure(s) Performed: ESOPHAGOGASTRODUODENOSCOPY (EGD) WITH PROPOFOL BIOPSY  Patient Location: PACU  Anesthesia Type:MAC  Level of Consciousness: awake, alert , oriented and patient cooperative  Airway & Oxygen Therapy: Patient Spontanous Breathing and Patient connected to face mask oxygen  Post-op Assessment: Report given to RN and Post -op Vital signs reviewed and stable  Post vital signs: Reviewed and stable  Last Vitals:  Vitals Value Taken Time  BP 131/57 04/25/21 1006  Temp 36.4 C 04/25/21 1006  Pulse 96 04/25/21 1008  Resp 24 04/25/21 1008  SpO2 99 % 04/25/21 1008  Vitals shown include unvalidated device data.  Last Pain:  Vitals:   04/25/21 1006  TempSrc: Oral  PainSc: 6          Complications: No notable events documented.

## 2021-04-25 NOTE — Anesthesia Postprocedure Evaluation (Deleted)
Anesthesia Post Note  Patient: Dusten Ellinwood  Procedure(s) Performed: ESOPHAGOGASTRODUODENOSCOPY (EGD) WITH PROPOFOL BIOPSY     Patient location during evaluation: Endoscopy Anesthesia Type: MAC Level of consciousness: awake and alert Pain management: pain level controlled Vital Signs Assessment: post-procedure vital signs reviewed and stable Respiratory status: spontaneous breathing, nonlabored ventilation, respiratory function stable and patient connected to nasal cannula oxygen Cardiovascular status: blood pressure returned to baseline and stable Postop Assessment: no apparent nausea or vomiting Anesthetic complications: no   No notable events documented.  Last Vitals:  Vitals:   04/25/21 0800 04/25/21 0932  BP: (!) 142/79 (!) 182/82  Pulse: 93 95  Resp: 15 (!) 22  Temp:  37.1 C  SpO2: 98% 99%    Last Pain:  Vitals:   04/25/21 0932  TempSrc: Oral  PainSc: 6                  Carsyn Boster L Anaeli Cornwall

## 2021-04-25 NOTE — Consult Note (Signed)
Reason for Consult:History of knee replacement with Joe Rivera Referring Physician: [name] is an 75 y.o. male.  HPI: 75 yo male who is s/p total knee replacement this week with Joe Rivera.  He began having weakness and dizziness, and DOE with black tarry stools. Patient admitted for work-up and treatment for suspected GI bleed. He was on Zarelto for post op DVT prophylaxis.   Past Medical History:  Diagnosis Date   Anxiety    does not take medication for   Arthritis    Cervical radicular pain    has pain in right arm   Deep vein thrombophlebitis of right leg ([place])    1991 or 1992   Diabetes mellitus without complication ([place])    Gout    history of, takes allopurinol as needed   Hypertension     Past Surgical History:  Procedure Laterality Date   ANTERIOR CERVICAL DECOMP/DISCECTOMY FUSION  05/06/2011   Procedure: ANTERIOR CERVICAL DECOMPRESSION/DISCECTOMY FUSION 1 LEVEL;  Surgeon: Joe Rivera;  Location: [facility];  Service: Orthopedics;  Laterality: Right;  ANTERIOR CERVICAL DECOMPRESSION AN FUSION C4-7 WITH INSTRUMENTATION RIGHT   HERNIA REPAIR     TESTICULAR EXPLORATION     TONSILLECTOMY     age 60 years old   84 TOOTH EXTRACTION      History reviewed. No pertinent family history.  Social History:  reports that he has never smoked. He has never used smokeless tobacco. He reports current alcohol use of about 7.0 standard drinks per week. He reports that he does not use drugs.  Allergies:  Allergies  Allergen Reactions   Lisinopril Swelling    Tongue    Medications: I have reviewed the patient's current medications.  Results for orders placed or performed during the hospital encounter of 04/23/21 (from the past 48 hour(s))  POC occult blood, ED Provider will collect     Status: Abnormal   Collection Time: 04/23/21 10:17 PM  Result Value Ref Range   Fecal Occult Bld POSITIVE (A) NEGATIVE  CBG monitoring, ED     Status: Abnormal   Collection  Time: 04/23/21 10:20 PM  Result Value Ref Range   Glucose-Capillary 237 (H) 70 - 99 mg/dL    Comment: Glucose reference range applies only to samples taken after fasting for at least 8 hours.   Comment 1 Notify RN   CBC WITH DIFFERENTIAL     Status: Abnormal   Collection Time: 04/23/21 10:22 PM  Result Value Ref Range   WBC 14.4 (H) 4.0 - 10.5 K/uL   RBC 1.98 (L) 4.22 - 5.81 MIL/uL   Hemoglobin 7.0 (L) 13.0 - 17.0 g/dL    Comment: REPEATED TO VERIFY   HCT 20.3 (L) 39.0 - 52.0 %   MCV 102.5 (H) 80.0 - 100.0 fL   MCH 35.4 (H) 26.0 - 34.0 pg   MCHC 34.5 30.0 - 36.0 g/dL   RDW 12.7 11.5 - 15.5 %   Platelets 200 150 - 400 K/uL   nRBC 0.2 0.0 - 0.2 %   Neutrophils Relative % 67 %   Neutro Abs 9.7 (H) 1.7 - 7.7 K/uL   Lymphocytes Relative 15 %   Lymphs Abs 2.1 0.7 - 4.0 K/uL   Monocytes Relative 16 %   Monocytes Absolute 2.3 (H) 0.1 - 1.0 K/uL   Eosinophils Relative 0 %   Eosinophils Absolute 0.0 0.0 - 0.5 K/uL   Basophils Relative 0 %   Basophils Absolute 0.0 0.0 - 0.1 K/uL  Immature Granulocytes 2 %   Abs Immature Granulocytes 0.21 (H) 0.00 - 0.07 K/uL    Comment: Performed at [facility], [place] [address], [place], [place] [address]  Comprehensive metabolic panel     Status: Abnormal   Collection Time: 04/23/21 10:22 PM  Result Value Ref Range   Sodium 131 (L) 135 - 145 mmol/L   Potassium 2.9 (L) 3.5 - 5.1 mmol/L    Comment: DELTA CHECK NOTED   Chloride 95 (L) 98 - 111 mmol/L   CO2 16 (L) 22 - 32 mmol/L   Glucose, Bld 251 (H) 70 - 99 mg/dL    Comment: Glucose reference range applies only to samples taken after fasting for at least 8 hours.   BUN 42 (H) 8 - 23 mg/dL   Creatinine, Ser 0.97 0.61 - 1.24 mg/dL   Calcium 7.3 (L) 8.9 - 10.3 mg/dL   Total Protein 5.2 (L) 6.5 - 8.1 g/dL   Albumin 2.5 (L) 3.5 - 5.0 g/dL   AST 30 15 - 41 U/L   ALT 19 0 - 44 U/L   Alkaline Phosphatase 39 38 - 126 U/L   Total Bilirubin 0.8 0.3 - 1.2 mg/dL   GFR, Estimated >60 >60  mL/min    Comment: (NOTE) Calculated using the CKD-EPI Creatinine Equation (2021)    Anion gap 20 (H) 5 - 15    Comment: Performed at [facility], [place] [address] [place], [place] [address]  Protime-INR     Status: Abnormal   Collection Time: 04/23/21 10:22 PM  Result Value Ref Range   Prothrombin Time 20.5 (H) 11.4 - 15.2 seconds   INR 1.8 (H) 0.8 - 1.2    Comment: (NOTE) INR goal varies based on device and disease states. Performed at [facility], [place] [address], [place], [place] [address]   Urinalysis, Routine w reflex microscopic     Status: Abnormal   Collection Time: 04/23/21 10:22 PM  Result Value Ref Range   Color, Urine STRAW (A) YELLOW   APPearance CLEAR CLEAR   Specific Gravity, Urine 1.015 1.005 - 1.030   pH 6.0 5.0 - 8.0   Glucose, UA 150 (A) NEGATIVE mg/dL   Hgb urine dipstick SMALL (A) NEGATIVE   Bilirubin Urine NEGATIVE NEGATIVE   Ketones, ur 20 (A) NEGATIVE mg/dL   Protein, ur NEGATIVE NEGATIVE mg/dL   Nitrite NEGATIVE NEGATIVE   Leukocytes,Ua NEGATIVE NEGATIVE   RBC / HPF 0-5 0 - 5 RBC/hpf   WBC, UA 0-5 0 - 5 WBC/hpf   Bacteria, UA NONE SEEN NONE SEEN   Mucus PRESENT     Comment: Performed at [facility], [place] [address] [place], [place] [address]  Resp Panel by RT-PCR (Flu A&B, Covid) Nasopharyngeal Swab     Status: None   Collection Time: 04/23/21 10:22 PM   Specimen: Nasopharyngeal Swab; Nasopharyngeal(NP) swabs in vial transport medium  Result Value Ref Range   SARS Coronavirus 2 by RT PCR NEGATIVE NEGATIVE    Comment: (NOTE) SARS-CoV-2 target nucleic acids are NOT DETECTED.  The SARS-CoV-2 RNA is generally detectable in upper respiratory specimens during the acute phase of infection. The lowest concentration of SARS-CoV-2 viral copies this assay can detect is 138 copies/mL. A negative result does not preclude SARS-Cov-2 infection and should not be used as the sole basis for treatment  or other patient management decisions. A negative result may occur with  improper specimen collection/handling, submission of specimen other than nasopharyngeal  swab, presence of viral mutation(s) within the areas targeted by this assay, and inadequate number of viral copies(<138 copies/mL). A negative result must be combined with clinical observations, patient history, and epidemiological information. The expected result is Negative.  Fact Sheet for Patients:  EntrepreneurPulse.com.au  Fact Sheet for Healthcare Providers:  IncredibleEmployment.be  This test is no t yet approved or cleared by the [place] FDA and  has been authorized for detection and/or diagnosis of SARS-CoV-2 by FDA under an Emergency Use Authorization (EUA). This EUA will remain  in effect (meaning this test can be used) for the duration of the COVID-19 declaration under Section 564(b)(1) of the Act, 21 U.S.C.section 360bbb-3(b)(1), unless the authorization is terminated  or revoked sooner.       Influenza A by PCR NEGATIVE NEGATIVE   Influenza B by PCR NEGATIVE NEGATIVE    Comment: (NOTE) The Xpert Xpress SARS-CoV-2/FLU/RSV plus assay is intended as an aid in the diagnosis of influenza from Nasopharyngeal swab specimens and should not be used as a sole basis for treatment. Nasal washings and aspirates are unacceptable for Xpert Xpress SARS-CoV-2/FLU/RSV testing.  Fact Sheet for Patients: EntrepreneurPulse.com.au  Fact Sheet for Healthcare Providers: IncredibleEmployment.be  This test is not yet approved or cleared by the [place] FDA and has been authorized for detection and/or diagnosis of SARS-CoV-2 by FDA under an Emergency Use Authorization (EUA). This EUA will remain in effect (meaning this test can be used) for the duration of the COVID-19 declaration under Section 564(b)(1) of the Act, 21 U.S.C. section  360bbb-3(b)(1), unless the authorization is terminated or revoked.  Performed at [facility], [place] [address] [place], [place] [address]   Magnesium     Status: Abnormal   Collection Time: 04/23/21 10:22 PM  Result Value Ref Range   Magnesium 2.8 (H) 1.7 - 2.4 mg/dL    Comment: Performed at [facility], [place] [address], [place], [place] [address]  Type and screen [place]     Status: None (Preliminary result)   Collection Time: 04/23/21 10:35 PM  Result Value Ref Range   ABO/RH(D) A POS    Antibody Screen NEG    Sample Expiration 04/26/2021,2359    Unit Number [id number]    Blood Component Type RED CELLS,LR    Unit division 00    Status of Unit ISSUED    Transfusion Status OK TO TRANSFUSE    Crossmatch Result Compatible    Unit Number [id number]    Blood Component Type RED CELLS,LR    Unit division 00    Status of Unit ISSUED    Transfusion Status OK TO TRANSFUSE    Crossmatch Result      Compatible Performed at [facility], [place] [address] [place], [place] [address]   Prepare RBC (crossmatch)     Status: None   Collection Time: 04/23/21 11:17 PM  Result Value Ref Range   Order Confirmation      ORDER PROCESSED BY BLOOD BANK Performed at [place] [address] [place], [place] [address]   CBG monitoring, ED     Status: Abnormal   Collection Time: 04/24/21  2:33 AM  Result Value Ref Range   Glucose-Capillary 255 (H) 70 - 99 mg/dL    Comment: Glucose reference range applies only to samples taken after fasting for at least 8 hours.  CBG monitoring, ED     Status: Abnormal   Collection Time: 04/24/21  3:50 AM  Result Value Ref Range   Glucose-Capillary 232 (H) 70 - 99 mg/dL    Comment: Glucose reference range applies only to samples taken after fasting for at least 8 hours.  CBC     Status: Abnormal   Collection Time: 04/24/21  5:00 AM  Result Value Ref Range    WBC 16.0 (H) 4.0 - 10.5 K/uL   RBC 2.88 (L) 4.22 - 5.81 MIL/uL   Hemoglobin 9.6 (L) 13.0 - 17.0 g/dL    Comment: REPEATED TO VERIFY POST TRANSFUSION SPECIMEN DELTA CHECK NOTED    HCT 27.0 (L) 39.0 - 52.0 %   MCV 93.8 80.0 - 100.0 fL    Comment: POST TRANSFUSION SPECIMEN REPEATED TO VERIFY DELTA CHECK NOTED    MCH 33.3 26.0 - 34.0 pg   MCHC 35.6 30.0 - 36.0 g/dL   RDW 15.0 11.5 - 15.5 %   Platelets 179 150 - 400 K/uL   nRBC 0.6 (H) 0.0 - 0.2 %    Comment: Performed at [facility], [place] [address] [place], [place] [address]  Comprehensive metabolic panel     Status: Abnormal   Collection Time: 04/24/21  5:00 AM  Result Value Ref Range   Sodium 134 (L) 135 - 145 mmol/L   Potassium 3.1 (L) 3.5 - 5.1 mmol/L   Chloride 98 98 - 111 mmol/L   CO2 18 (L) 22 - 32 mmol/L   Glucose, Bld 231 (H) 70 - 99 mg/dL    Comment: Glucose reference range applies only to samples taken after fasting for at least 8 hours.   BUN 47 (H) 8 - 23 mg/dL   Creatinine, Ser 0.97 0.61 - 1.24 mg/dL   Calcium 7.3 (L) 8.9 - 10.3 mg/dL   Total Protein 5.7 (L) 6.5 - 8.1 g/dL   Albumin 2.9 (L) 3.5 - 5.0 g/dL   AST 31 15 - 41 U/L   ALT 23 0 - 44 U/L   Alkaline Phosphatase 40 38 - 126 U/L   Total Bilirubin 1.2 0.3 - 1.2 mg/dL   GFR, Estimated >60 >60 mL/min    Comment: (NOTE) Calculated using the CKD-EPI Creatinine Equation (2021)    Anion gap 18 (H) 5 - 15    Comment: Performed at [facility], [place] [address], [place], [place] [address]  Magnesium     Status: Abnormal   Collection Time: 04/24/21  5:00 AM  Result Value Ref Range   Magnesium 2.5 (H) 1.7 - 2.4 mg/dL    Comment: Performed at [facility], [place] [address], [place], [place] [address]  Phosphorus     Status: Abnormal   Collection Time: 04/24/21  5:00 AM  Result Value Ref Range   Phosphorus 2.1 (L) 2.5 - 4.6 mg/dL    Comment: Performed at [facility], [place] [address], [place], [place] [address]  CBG monitoring, ED     Status: Abnormal   Collection Time: 04/24/21  7:55 AM  Result Value Ref Range   Glucose-Capillary 226 (H) 70 - 99 mg/dL    Comment: Glucose reference range applies only to samples taken after fasting for at least 8 hours.  Hemoglobin and hematocrit, blood     Status: Abnormal   Collection Time: 04/24/21 11:00 AM  Result Value Ref Range   Hemoglobin 9.2 (L) 13.0 - 17.0 g/dL   HCT 25.5 (L) 39.0 - 52.0 %    Comment: Performed at [facility], [place] [address] [place], [place]  [address]  CBG monitoring, ED     Status: Abnormal   Collection Time: 04/24/21 11:12 AM  Result Value Ref Range   Glucose-Capillary 207 (H) 70 - 99 mg/dL    Comment: Glucose reference range applies only to samples taken after fasting for at least 8 hours.  CBG monitoring, ED     Status: Abnormal   Collection Time: 04/24/21  5:27 PM  Result Value Ref Range   Glucose-Capillary 225 (H) 70 - 99 mg/dL    Comment: Glucose reference range applies only to samples taken after fasting for at least 8 hours.  Hemoglobin and hematocrit, blood     Status: Abnormal   Collection Time: 04/24/21  6:16 PM  Result Value Ref Range   Hemoglobin 8.3 (L) 13.0 - 17.0 g/dL   HCT 23.5 (L) 39.0 - 52.0 %    Comment: Performed at [facility], [place] [address], [place], [place] [address]  CBG monitoring, ED     Status: Abnormal   Collection Time: 04/24/21  7:24 PM  Result Value Ref Range   Glucose-Capillary 251 (H) 70 - 99 mg/dL    Comment: Glucose reference range applies only to samples taken after fasting for at least 8 hours.  CBG monitoring, ED     Status: Abnormal   Collection Time: 04/24/21 10:07 PM  Result Value Ref Range   Glucose-Capillary 207 (H) 70 - 99 mg/dL    Comment: Glucose reference range applies only to samples taken after fasting for at least 8 hours.  CBG monitoring, ED     Status: Abnormal   Collection Time: 04/24/21 11:21 PM   Result Value Ref Range   Glucose-Capillary 201 (H) 70 - 99 mg/dL    Comment: Glucose reference range applies only to samples taken after fasting for at least 8 hours.  CBG monitoring, ED     Status: Abnormal   Collection Time: 04/25/21  2:49 AM  Result Value Ref Range   Glucose-Capillary 108 (H) 70 - 99 mg/dL    Comment: Glucose reference range applies only to samples taken after fasting for at least 8 hours.  Magnesium     Status: None   Collection Time: 04/25/21  3:01 AM  Result Value Ref Range   Magnesium 2.2 1.7 - 2.4 mg/dL    Comment: Performed at [facility], [place] [address], [place], [place] [address]  Renal function panel     Status: Abnormal   Collection Time: 04/25/21  3:01 AM  Result Value Ref Range   Sodium 137 135 - 145 mmol/L   Potassium 3.1 (L) 3.5 - 5.1 mmol/L   Chloride 103 98 - 111 mmol/L   CO2 26 22 - 32 mmol/L   Glucose, Bld 170 (H) 70 - 99 mg/dL    Comment: Glucose reference range applies only to samples taken after fasting for at least 8 hours.   BUN 27 (H) 8 - 23 mg/dL   Creatinine, Ser 0.65 0.61 - 1.24 mg/dL   Calcium 7.3 (L) 8.9 - 10.3 mg/dL   Phosphorus 2.0 (L) 2.5 - 4.6 mg/dL   Albumin 3.0 (L) 3.5 - 5.0 g/dL   GFR, Estimated >60 >60 mL/min    Comment: (NOTE) Calculated using the CKD-EPI Creatinine Equation (2021)    Anion gap 8 5 - 15    Comment: Performed at [facility], [place] [address] [place], [place] [address]  CBG monitoring, ED     Status: Abnormal   Collection Time: 04/25/21  7:47 AM  Result Value Ref Range   Glucose-Capillary 189 (H) 70 - 99 mg/dL    Comment: Glucose reference range applies only to samples taken after fasting for at least 8 hours.  CBC with Differential/Platelet     Status: Abnormal   Collection Time: 04/25/21  8:50 AM  Result Value Ref Range   WBC 9.7 4.0 - 10.5 K/uL   RBC 2.46 (L) 4.22 - 5.81 MIL/uL   Hemoglobin 8.3 (L) 13.0 - 17.0 g/dL   HCT 23.6 (L) 39.0 - 52.0 %   MCV 95.9  80.0 - 100.0 fL   MCH 33.7 26.0 - 34.0 pg   MCHC 35.2 30.0 - 36.0 g/dL   RDW 16.0 (H) 11.5 - 15.5 %   Platelets 143 (L) 150 - 400 K/uL   nRBC 0.8 (H) 0.0 - 0.2 %   Neutrophils Relative % 71 %   Neutro Abs 6.9 1.7 - 7.7 K/uL   Lymphocytes Relative 14 %   Lymphs Abs 1.4 0.7 - 4.0 K/uL   Monocytes Relative 13 %   Monocytes Absolute 1.3 (H) 0.1 - 1.0 K/uL   Eosinophils Relative 1 %   Eosinophils Absolute 0.1 0.0 - 0.5 K/uL   Basophils Relative 0 %   Basophils Absolute 0.0 0.0 - 0.1 K/uL   Immature Granulocytes 1 %   Abs Immature Granulocytes 0.06 0.00 - 0.07 K/uL    Comment: Performed at [facility], [place] [address], [place], [place] [address]    DG Chest Port 1 View  Result Date: 04/23/2021 CLINICAL DATA:  Shortness of breath. EXAM: PORTABLE CHEST 1 VIEW COMPARISON:  Chest x-ray 05/04/2011. FINDINGS: Patient is rotated. Cardiomediastinal silhouette is within normal limits for degree of rotation. The lungs and costophrenic angles are clear. There is no pneumothorax or acute fracture. IMPRESSION: No active disease. Electronically Signed   By: Ronney Asters M.D.   On: 04/23/2021 22:32    Review of Systems Blood pressure (!) 142/79, pulse 93, temperature 97.8 F (36.6 C), temperature source Oral, resp. rate 15, height 5' 10.5" (1.791 m), weight 115.7 kg, SpO2 98 %. Physical Exam Patient is awake and alert and oriented in NAD Right knee dressing CDI, swelling in the leg and knee is normal at this stage. Compartments are not tense, no pain with gentle ROM of the knee and ankle. Neg Homans test.   Assessment/Plan: GI bleed following TKR. Therapy as tolerated for mobilization WBAT on the right and AROM of the knee Will follow with you. Will make Joe Rivera aware  Joe Rivera 04/25/2021, 9:08 AM

## 2021-04-25 NOTE — Anesthesia Preprocedure Evaluation (Addendum)
Anesthesia Evaluation  Patient identified by MRN, date of birth, ID band Patient awake    Reviewed: Allergy & Precautions, NPO status , Patient's Chart, lab work & pertinent test results, reviewed documented beta blocker date and time   Airway Mallampati: II  TM Distance: >3 FB Neck ROM: Full    Dental  (+) Poor Dentition, Missing   Pulmonary neg pulmonary ROS,    Pulmonary exam normal breath sounds clear to auscultation       Cardiovascular hypertension, Pt. on home beta blockers and Pt. on medications + DVT  Normal cardiovascular exam Rhythm:Regular Rate:Normal     Neuro/Psych PSYCHIATRIC DISORDERS Anxiety negative neurological ROS     GI/Hepatic negative GI ROS, Neg liver ROS,   Endo/Other  diabetes, Type 2, Oral Hypoglycemic Agents  Renal/GU negative Renal ROS  negative genitourinary   Musculoskeletal  (+) Arthritis ,   Abdominal   Peds  Hematology  (+) Blood dyscrasia, anemia , Lab Results      Component                Value               Date                      WBC                      16.0 (H)            04/24/2021                HGB                      8.3 (L)             04/24/2021                HCT                      23.5 (L)            04/24/2021                MCV                      93.8                04/24/2021                PLT                      179                 04/24/2021              Anesthesia Other Findings Recent TKA on 04/21/21. Placed on xarelto post op. Started experiencing black stools, weakness, and dizziness. Upon arrival to ED, Hgb 7 down from 12.5. Hgb now 8.3  Reproductive/Obstetrics                           Anesthesia Physical Anesthesia Plan  ASA: 3  Anesthesia Plan: MAC   Post-op Pain Management:    Induction: Intravenous  PONV Risk Score and Plan: Propofol infusion and Treatment may vary due to age or medical condition  Airway  Management Planned: Natural Airway  Additional Equipment:   Intra-op Plan:   Post-operative  Plan:   Informed Consent: I have reviewed the patients History and Physical, chart, labs and discussed the procedure including the risks, benefits and alternatives for the proposed anesthesia with the patient or authorized representative who has indicated his/her understanding and acceptance.     Dental advisory given  Plan Discussed with: CRNA  Anesthesia Plan Comments:         Anesthesia Quick Evaluation

## 2021-04-26 ENCOUNTER — Inpatient Hospital Stay: Payer: Self-pay

## 2021-04-26 ENCOUNTER — Other Ambulatory Visit: Payer: Self-pay

## 2021-04-26 DIAGNOSIS — K259 Gastric ulcer, unspecified as acute or chronic, without hemorrhage or perforation: Secondary | ICD-10-CM | POA: Diagnosis present

## 2021-04-26 DIAGNOSIS — R600 Localized edema: Secondary | ICD-10-CM | POA: Diagnosis not present

## 2021-04-26 DIAGNOSIS — K253 Acute gastric ulcer without hemorrhage or perforation: Secondary | ICD-10-CM

## 2021-04-26 LAB — RENAL FUNCTION PANEL
Albumin: 2.7 g/dL — ABNORMAL LOW (ref 3.5–5.0)
Anion gap: 5 (ref 5–15)
BUN: 14 mg/dL (ref 8–23)
CO2: 28 mmol/L (ref 22–32)
Calcium: 7.2 mg/dL — ABNORMAL LOW (ref 8.9–10.3)
Chloride: 106 mmol/L (ref 98–111)
Creatinine, Ser: 0.61 mg/dL (ref 0.61–1.24)
GFR, Estimated: >60 mL/min (ref >60)
Glucose, Bld: 190 mg/dL — ABNORMAL HIGH (ref 70–99)
Phosphorus: 2 mg/dL — ABNORMAL LOW (ref 2.5–4.6)
Potassium: 3.3 mmol/L — ABNORMAL LOW (ref 3.5–5.1)
Sodium: 139 mmol/L (ref 135–145)

## 2021-04-26 LAB — CBC
HCT: 20.9 % — ABNORMAL LOW (ref 39.0–52.0)
Hemoglobin: 7.3 g/dL — ABNORMAL LOW (ref 13.0–17.0)
MCH: 34.1 pg — ABNORMAL HIGH (ref 26.0–34.0)
MCHC: 34.9 g/dL (ref 30.0–36.0)
MCV: 97.7 fL (ref 80.0–100.0)
Platelets: 130 10*3/uL — ABNORMAL LOW (ref 150–400)
RBC: 2.14 MIL/uL — ABNORMAL LOW (ref 4.22–5.81)
RDW: 15.5 % (ref 11.5–15.5)
WBC: 5.1 10*3/uL (ref 4.0–10.5)
nRBC: 0.6 % — ABNORMAL HIGH (ref 0.0–0.2)

## 2021-04-26 LAB — GLUCOSE, CAPILLARY
Glucose-Capillary: 191 mg/dL — ABNORMAL HIGH (ref 70–99)
Glucose-Capillary: 208 mg/dL — ABNORMAL HIGH (ref 70–99)
Glucose-Capillary: 146 mg/dL — ABNORMAL HIGH (ref 70–99)
Glucose-Capillary: 201 mg/dL — ABNORMAL HIGH (ref 70–99)

## 2021-04-26 LAB — PREPARE RBC (CROSSMATCH)

## 2021-04-26 LAB — MAGNESIUM: Magnesium: 1.8 mg/dL (ref 1.7–2.4)

## 2021-04-26 LAB — VITAMIN D 25 HYDROXY (VIT D DEFICIENCY, FRACTURES): Vit D, 25-Hydroxy: 37.71 ng/mL (ref 30–100)

## 2021-04-26 MED ORDER — CLONIDINE HCL 0.1 MG PO TABS
0.1000 mg | ORAL_TABLET | Freq: Two times a day (BID) | ORAL | Status: DC
  Administered 2021-04-26 – 2021-04-28 (×4): 0.1 mg via ORAL
  Filled 2021-04-26 (×4): qty 1

## 2021-04-26 MED ORDER — SODIUM CHLORIDE 0.9% IV SOLUTION: Freq: Once | INTRAVENOUS | Status: AC

## 2021-04-26 MED ORDER — POTASSIUM PHOSPHATES 15 MMOLE/5ML IV SOLN
15.0000 mmol | Freq: Once | INTRAVENOUS | Status: AC
  Administered 2021-04-27: 15 mmol via INTRAVENOUS
  Filled 2021-04-26: qty 5

## 2021-04-26 MED ORDER — FUROSEMIDE 10 MG/ML IJ SOLN
40.0000 mg | Freq: Once | INTRAMUSCULAR | Status: AC
  Administered 2021-04-26: 40 mg via INTRAVENOUS

## 2021-04-26 MED ORDER — FUROSEMIDE 10 MG/ML IJ SOLN
INTRAMUSCULAR | Status: AC
  Filled 2021-04-26: qty 4

## 2021-04-26 MED ORDER — FUROSEMIDE 10 MG/ML IJ SOLN: 20.0000 mg | Freq: Once | INTRAMUSCULAR | Status: DC

## 2021-04-26 MED ORDER — POTASSIUM CHLORIDE 20 MEQ PO PACK
40.0000 meq | PACK | Freq: Once | ORAL | Status: AC
  Administered 2021-04-26: 40 meq via ORAL
  Filled 2021-04-26: qty 2

## 2021-04-26 MED ORDER — DIPHENHYDRAMINE HCL 25 MG PO CAPS
25.0000 mg | ORAL_CAPSULE | Freq: Once | ORAL | Status: AC
  Administered 2021-04-26: 25 mg via ORAL
  Filled 2021-04-26: qty 1

## 2021-04-26 MED ORDER — CHLORHEXIDINE GLUCONATE CLOTH 2 % EX PADS
6.0000 | MEDICATED_PAD | Freq: Every day | CUTANEOUS | Status: DC
  Administered 2021-04-27 (×2): 6 via TOPICAL

## 2021-04-26 MED ORDER — FUROSEMIDE 10 MG/ML IJ SOLN
40.0000 mg | Freq: Two times a day (BID) | INTRAMUSCULAR | Status: DC
  Administered 2021-04-26 – 2021-04-27 (×2): 40 mg via INTRAVENOUS
  Filled 2021-04-26 (×2): qty 4

## 2021-04-26 MED ORDER — ACETAMINOPHEN 325 MG PO TABS
650.0000 mg | ORAL_TABLET | Freq: Once | ORAL | Status: AC
  Administered 2021-04-26: 650 mg via ORAL
  Filled 2021-04-26: qty 2

## 2021-04-26 MED ORDER — SODIUM CHLORIDE 0.9% FLUSH: 10.0000 mL | INTRAVENOUS | Status: DC | PRN

## 2021-04-26 MED ORDER — OYSTER SHELL CALCIUM/D3 500-5 MG-MCG PO TABS
1.0000 | ORAL_TABLET | Freq: Three times a day (TID) | ORAL | Status: DC
  Administered 2021-04-26 – 2021-04-28 (×6): 1 via ORAL
  Filled 2021-04-26 (×6): qty 1

## 2021-04-26 MED ORDER — POTASSIUM PHOSPHATES 15 MMOLE/5ML IV SOLN
30.0000 mmol | Freq: Once | INTRAVENOUS | Status: AC
  Administered 2021-04-26: 30 mmol via INTRAVENOUS
  Filled 2021-04-26: qty 10

## 2021-04-26 MED ORDER — MAGNESIUM SULFATE 2 GM/50ML IV SOLN
2.0000 g | Freq: Once | INTRAVENOUS | Status: AC
  Administered 2021-04-26: 2 g via INTRAVENOUS
  Filled 2021-04-26: qty 50

## 2021-04-26 MED ORDER — INSULIN GLARGINE-YFGN 100 UNIT/ML ~~LOC~~ SOLN
8.0000 [IU] | Freq: Every day | SUBCUTANEOUS | Status: DC
  Administered 2021-04-26: 8 [IU] via SUBCUTANEOUS
  Filled 2021-04-26: qty 0.08

## 2021-04-26 MED ORDER — SODIUM CHLORIDE 0.9% FLUSH
10.0000 mL | Freq: Two times a day (BID) | INTRAVENOUS | Status: DC
  Administered 2021-04-26 – 2021-04-28 (×4): 10 mL

## 2021-04-26 NOTE — Progress Notes (Signed)
After multiple unsuccessful attempts by the IV Team member writer returned Unit of blood to the blood bank. Blood is placed on standby until access is established. IV Team member spoke with colleague and both are recommending a PICC due to patient's current condition and requirements. IV- Team ultra sound at [facility] will be notified. Patient updated, Charge updated and message sent to MD.

## 2021-04-26 NOTE — Progress Notes (Signed)
Contacted MD to request another unit of blood due to loss of IV access. PICC line has just been completed and preparations for to initiate transfusion are complete. PRBCs released and getting picked up.

## 2021-04-26 NOTE — Progress Notes (Signed)
S[place] Side Mars[place]ano 11:45 AM  Subjective: Patient without any signs of bleeding and no problems from his endoscopy and we rediscussed the diagnosis and answered all of his questions and we discussed dilution of his blood count with IV fluids and he has now shortness of breath or dyspnea on exertion and no other complaints and we discussed the risks of blood clots without blood thinners after his type of surgery as well as no aspirin or nonsteroidals moving forward and awaiting the biopsies for H. pylori  Objective: Vital signs stable afebrile no acute distress abdomen is soft nontender slight drop in hemoglobin with decreased BUN and creatinine  Assessment: Seemingly resolved melena from ulcers and blood thinner  Plan: Hopefully he can go home tomorrow and will call me for biopsies and follow-up and please call us if we can be of any further assistance with this hospital stay  [facility]  office [phone] After 5PM or if no answer call [phone]

## 2021-04-26 NOTE — Progress Notes (Signed)
Contacted pharmacy and Notified MD about K-Phos not completed due to loss of IV access. Awaiting new order after PICC line is placed.

## 2021-04-26 NOTE — Progress Notes (Signed)
IV team here and attempting to establish access.

## 2021-04-26 NOTE — Progress Notes (Signed)
PROGRESS NOTE    Joe Rivera  VOZ:[id number] DOB: 1945-12-23 DOA: 04/23/2021 PCP: [facility]    No chief complaint on file.   Brief Narrative:  Patient 75 year old gentleman history of recent right total knee replacement 04/21/2021 per orthopedics, discharged home on Xarelto for DVT prophylaxis presenting to the ED with generalized weakness dizziness, shortness of breath on minimal exertion, black tarry stools.  Patient noted to have a hemoglobin of 7 from 12.5 on day of discharge on presentation to the ED.  Patient admitted due to concern for upper GI bleed.  GI consulted.  Xarelto discontinued.  Orthopedics notified of admission and consulted.   Assessment & Plan:   Principal Problem:   Upper GI bleed Active Problems:   GI bleed   Symptomatic anemia   Acute blood loss anemia   Hypokalemia   Hypomagnesemia   Hyponatremia   Type 2 diabetes mellitus without complication (HCC)   S/P TKR (total knee replacement), right   Leukocytosis   Hyperlipidemia   Hypocalcemia   Ambulatory dysfunction   Gastric ulcer: Per EDG 04/25/2021   Bilateral lower extremity edema  1 probable upper GI bleed/acute blood loss anemia/symptomatic anemia/gastric ulcers -Patient presented with symptomatic anemia after recent total right knee replacement discharged on 04/22/2021 on Xarelto for anticoagulation.  DOAC was started after discharge. -Hemoglobin on presentation was 7.0 from 12.5 on day prior to discharge. -Patient noted to be taking ibuprofen 800 mg daily for several months prior to knee replacement. -Status post transfusion 2 units packed red blood cells with posttransfusion H&H at 9.6 initially. -Patient denies any further black tarry/melanotic stools in the past 24 hours.  -Symptomatic improvement.  -Hemoglobin currently at 7.3 today from 8.3 yesterday.   -Status post upper endoscopy 04/25/2021 per [name] with few nonbleeding cratered and linear gastric ulcers with no  stigmata of bleeding found in the gastric antrum and in the prepyloric region of the stomach.  Biopsies pending. -Transfused 2 more units packed red blood cells as hemoglobin at 7.3 today.   -Patient was on a Protonix drip and has been transitioned to Protonix 40 mg twice daily per GI with recommendations to continue for 1 month and then 40 mg daily thereafterwards.   -GI recommended no NSAIDs, no ibuprofen, no antiplatelets at this time, and if absolutely needed could retry blood thinner or antiplatelet agent in 5 days.  -Transfusion threshold hemoglobin < 7.  -GI consulted and following.   2.  Hypokalemia/hypophosphatemia -Potassium repleted however potassium still at 3.4 today.   -Phosphorus at 2.0.   -Potassium packet 40 mEq p.o. x1.   -K-Phos 30 mmol IV x1.   -Continue oral phosphorus repletion.   -Repeat labs in the morning.   3.  Leukocytosis -Likely reactive leukocytosis. -Patient afebrile. -Urinalysis unremarkable. -Chest x-ray no acute infiltrate. -Leukocytosis trended down. -Monitor off antibiotics.  4.  Anion gap metabolic acidosis -Serum bicarb noted at 16 with anion gap of 20 on admission. -Patient on IV fluids. -Patient given bicarb tablets 1300 mg 3 times daily x3 doses. -Bicarb currently at 28, anion gap closed at 5. -Follow.  5.  Hypovolemic hyponatremia -Improved with hydration. -Saline lock IV fluids.  6.  Recent total right knee replacement on 04/21/2021 per Dr. Alvan Dame -Continue to hold Xarelto secondary to problem #1. -SCDs for DVT prophylaxis.   -PT/OT.   -Pain management.   -Orthopedics aware of admission.    7.  Hypertension -BP noted to be soft on admission. -BP improved with hydration.   -  Saline lock IV fluids. -Continue home regimen Norvasc 5 mg daily.   -Patient to receive Lasix 40 mg IV x1 this morning and then Lasix 20 mg IV in between transfusion of packed red blood cells.  -Resume home regimen clonidine.  8.  Diabetes mellitus type  2 -Hemoglobin A1c 6.5 (04/10/2021) -CBG 191 this morning. -Increase Semglee to 8 units daily.  Continue SSI.   -Continue to hold oral hypoglycemic agents.   -Change CBGs to before meals and at bedtime.  9.  Hypocalcemia -25-hydroxy vitamin D level at 37.71.   -Os-Cal with vitamin D 3 times daily.   -Follow.    10.  Hyperlipidemia -Continue statin.  11.  Ambulatory dysfunction -PT/OT. -Fall precautions.  12.  Bilateral lower extremity edema -Likely secondary to aggressive fluid resuscitation on presentation addition of transfusion of 2 units packed red blood cells. -Placed on Lasix 40 mg IV every 12 hours x4 doses.   DVT prophylaxis: SCDs Code Status: Full Family Communication: Updated patient and wife at bedside. Disposition:   Status is: Inpatient  Remains inpatient appropriate because: Inpatient treatment appropriate for this patient patient presented with significant anemia with sudden drop in hemoglobin, requiring transfusion of packed red blood cells and evaluation for GI for possible EGD.     Consultants:  Gastroenterology: Dr. Michail Sermon 04/24/2021  Procedures:  Chest x-ray 04/23/2021 Transfusion 2 units packed red blood cells pending 04/26/2021 Transfusion 2 units packed red blood cells 04/24/2021 Upper endoscopy per Dr.Magod 04/25/2021  Antimicrobials:  None   Subjective: Laying in bed on the telephone.  Denies any chest pain.  No shortness of breath.  No abdominal pain.  Denies any further melena or coffee-ground emesis.  Some complaints of lightheadedness or dizziness.  States he just saw gastroenterology.  Tolerating current diet.    Objective: Vitals:   04/25/21 2132 04/26/21 0227 04/26/21 0638 04/26/21 1200  BP: (!) 148/78 (!) 145/81 (!) 143/85 (!) 167/84  Pulse: 82 89 92 97  Resp: 16 16 18    Temp: 97.8 F (36.6 C) 98.1 F (36.7 C) 97.7 F (36.5 C) 98.3 F (36.8 C)  TempSrc: Oral Oral Oral Oral  SpO2: 100% 98% 99% 99%  Weight:      Height:         Intake/Output Summary (Last 24 hours) at 04/26/2021 1716 Last data filed at 04/26/2021 1700 Gross per 24 hour  Intake 2907.5 ml  Output 900 ml  Net 2007.5 ml   Filed Weights   04/24/21 0933 04/25/21 0932  Weight: 115.7 kg 115.7 kg    Examination:  General exam: NAD Respiratory system: CTA B.  No wheezes, no crackles, no rhonchi.  Normal respiratory effort.  Speaking in full sentences.   Cardiovascular system: RRR no murmurs rubs or gallops.  No JVD. 2+ right lower extremity edema, 1-2+ left lower extremity edema.   Gastrointestinal system: Abdomen is soft, nontender, nondistended, positive bowel sounds.  No rebound.  No guarding. Central nervous system: Alert and oriented. No focal neurological deficits. Extremities: Right lower extremity with some knee swelling, no erythema, nontender to palpation.  Right lower extremity with 2+ edema, left lower extremity with 1-2+ edema. Skin: No rashes, lesions or ulcers Psychiatry: Judgement and insight appear normal. Mood & affect appropriate.     Data Reviewed: I have personally reviewed following labs and imaging studies  CBC: Recent Labs  Lab 04/23/21 2222 04/24/21 0500 04/24/21 1100 04/24/21 1816 04/25/21 0850 04/25/21 2002 04/26/21 0538  WBC 14.4* 16.0*  --   --  9.7 7.2 5.1  NEUTROABS 9.7*  --   --   --  6.9 4.9  --   HGB 7.0* 9.6* 9.2* 8.3* 8.3* 7.5* 7.3*  HCT 20.3* 27.0* 25.5* 23.5* 23.6* 22.0* 20.9*  MCV 102.5* 93.8  --   --  95.9 100.0 97.7  PLT 200 179  --   --  143* 135* 130*    Basic Metabolic Panel: Recent Labs  Lab 04/22/21 0334 04/23/21 2222 04/24/21 0500 04/25/21 0301 04/26/21 0538  NA 130* 131* 134* 137 139  K 3.6 2.9* 3.1* 3.1* 3.3*  CL 94* 95* 98 103 106  CO2 29 16* 18* 26 28  GLUCOSE 269* 251* 231* 170* 190*  BUN 14 42* 47* 27* 14  CREATININE 0.81 0.97 0.97 0.65 0.61  CALCIUM 8.1* 7.3* 7.3* 7.3* 7.2*  MG  --  2.8* 2.5* 2.2 1.8  PHOS  --   --  2.1* 2.0* 2.0*    GFR: Estimated  Creatinine Clearance: 105.6 mL/min (by C-G formula based on SCr of 0.61 mg/dL).  Liver Function Tests: Recent Labs  Lab 04/23/21 2222 04/24/21 0500 04/25/21 0301 04/26/21 0538  AST 30 31  --   --   ALT 19 23  --   --   ALKPHOS 39 40  --   --   BILITOT 0.8 1.2  --   --   PROT 5.2* 5.7*  --   --   ALBUMIN 2.5* 2.9* 3.0* 2.7*    CBG: Recent Labs  Lab 04/25/21 1636 04/25/21 2134 04/26/21 0734 04/26/21 1200 04/26/21 1601  GLUCAP 178* 165* 191* 208* 146*     Recent Results (from the past 240 hour(s))  SARS Coronavirus 2 (TAT 6-24 hrs)     Status: None   Collection Time: 04/17/21 12:00 AM  Result Value Ref Range Status   SARS Coronavirus 2 RESULT: NEGATIVE  Final    Comment: RESULT: NEGATIVESARS-CoV-2 INTERPRETATION:A NEGATIVE  test result means that SARS-CoV-2 RNA was not present in the specimen above the limit of detection of this test. This does not preclude a possible SARS-CoV-2 infection and should not be used as the  sole basis for patient management decisions. Negative results must be combined with clinical observations, patient history, and epidemiological information. Optimum specimen types and timing for peak viral levels during infections caused by SARS-CoV-2  have not been determined. Collection of multiple specimens or types of specimens may be necessary to detect virus. Improper specimen collection and handling, sequence variability under primers/probes, or organism present below the limit of detection may  lead to false negative results. Positive and negative predictive values of testing are highly dependent on prevalence. False negative test results are more likely when prevalence of disease is high.The expected result is NEGATIVE.Fact S heet for  Healthcare Providers: LocalChronicle.no Sheet for Patients: SalonLookup.es Reference Range - Negative   Resp Panel by RT-PCR (Flu A&B, Covid) Nasopharyngeal Swab      Status: None   Collection Time: 04/23/21 10:22 PM   Specimen: Nasopharyngeal Swab; Nasopharyngeal(NP) swabs in vial transport medium  Result Value Ref Range Status   SARS Coronavirus 2 by RT PCR NEGATIVE NEGATIVE Final    Comment: (NOTE) SARS-CoV-2 target nucleic acids are NOT DETECTED.  The SARS-CoV-2 RNA is generally detectable in upper respiratory specimens during the acute phase of infection. The lowest concentration of SARS-CoV-2 viral copies this assay can detect is 138 copies/mL. A negative result does not preclude SARS-Cov-2 infection and should not be used as the sole basis  for treatment or other patient management decisions. A negative result may occur with  improper specimen collection/handling, submission of specimen other than nasopharyngeal swab, presence of viral mutation(s) within the areas targeted by this assay, and inadequate number of viral copies(<138 copies/mL). A negative result must be combined with clinical observations, patient history, and epidemiological information. The expected result is Negative.  Fact Sheet for Patients:  EntrepreneurPulse.com.au  Fact Sheet for Healthcare Providers:  IncredibleEmployment.be  This test is no t yet approved or cleared by the [place] FDA and  has been authorized for detection and/or diagnosis of SARS-CoV-2 by FDA under an Emergency Use Authorization (EUA). This EUA will remain  in effect (meaning this test can be used) for the duration of the COVID-19 declaration under Section 564(b)(1) of the Act, 21 U.S.C.section 360bbb-3(b)(1), unless the authorization is terminated  or revoked sooner.       Influenza A by PCR NEGATIVE NEGATIVE Final   Influenza B by PCR NEGATIVE NEGATIVE Final    Comment: (NOTE) The Xpert Xpress SARS-CoV-2/FLU/RSV plus assay is intended as an aid in the diagnosis of influenza from Nasopharyngeal swab specimens and should not be used as a sole basis  for treatment. Nasal washings and aspirates are unacceptable for Xpert Xpress SARS-CoV-2/FLU/RSV testing.  Fact Sheet for Patients: EntrepreneurPulse.com.au  Fact Sheet for Healthcare Providers: IncredibleEmployment.be  This test is not yet approved or cleared by the [place] FDA and has been authorized for detection and/or diagnosis of SARS-CoV-2 by FDA under an Emergency Use Authorization (EUA). This EUA will remain in effect (meaning this test can be used) for the duration of the COVID-19 declaration under Section 564(b)(1) of the Act, 21 U.S.C. section 360bbb-3(b)(1), unless the authorization is terminated or revoked.  Performed at [facility], [place] [address] [place] [address]           Radiology Studies: [place] EKG SITE RITE  Result Date: 04/26/2021 If Site Rite image not attached, placement could not be confirmed due to current cardiac rhythm.       Scheduled Meds:  amLODipine  5 mg Oral Daily   atorvastatin  20 mg Oral QHS   calcium-vitamin D  1 tablet Oral TID   cloNIDine  0.1 mg Oral BID   furosemide  20 mg Intravenous Once   insulin aspart  0-20 Units Subcutaneous TID WC   insulin glargine-yfgn  8 Units Subcutaneous QHS   pantoprazole  40 mg Oral BID AC   phosphorus  500 mg Oral BID   Continuous Infusions:     LOS: 3 days    Time spent: 40 minutes    Irine Seal, MD [facility]   To contact the attending provider between 7A-7P or the covering provider during after hours 7P-7A, please log into the web site [email or web address] and access using universal [place] password for that web site. If you do not have the password, please call the hospital operator.  04/26/2021, 5:16 PM

## 2021-04-26 NOTE — Progress Notes (Signed)
Peripherally Inserted Central Catheter Placement  The IV Nurse has discussed with the patient and/or persons authorized to consent for the patient, the purpose of this procedure and the potential benefits and risks involved with this procedure.  The benefits include less needle sticks, lab draws from the catheter, and the patient may be discharged home with the catheter. Risks include, but not limited to, infection, bleeding, blood clot (thrombus formation), and puncture of an artery; nerve damage and irregular heartbeat and possibility to perform a PICC exchange if needed/ordered by physician.  Alternatives to this procedure were also discussed.  Bard Power PICC patient education guide, fact sheet on infection prevention and patient information card has been provided to patient /or left at bedside.    PICC Placement Documentation  PICC Double Lumen 04/26/21 PICC Right Brachial 41 cm 0 cm (Active)  Indication for Insertion or Continuance of Line Poor Vasculature-patient has had multiple peripheral attempts or PIVs lasting less than 24 hours 04/26/21 1758  Exposed Catheter (cm) 0 cm 04/26/21 1758  Site Assessment Clean;Dry;Intact 04/26/21 1758  Lumen #1 Status Flushed;Saline locked;Blood return noted 04/26/21 1758  Lumen #2 Status Flushed;Saline locked;Blood return noted 04/26/21 1758  Dressing Type Transparent;Securing device 04/26/21 1758  Dressing Status Clean;Dry;Intact 04/26/21 1758  Antimicrobial disc in place? Yes 04/26/21 1758  Safety Lock Not Applicable 09/73/53 2992  Line Care Connections checked and tightened 04/26/21 1758  Line Adjustment (NICU/IV Team Only) No 04/26/21 1758  Dressing Intervention New dressing 04/26/21 1758  Dressing Change Due 05/02/21 04/26/21 1758       Rolena Infante 04/26/2021, 6:00 PM

## 2021-04-26 NOTE — Progress Notes (Signed)
Blood administration has been paused due to leaking IV and eventual loss of IV. Notified charge after 2 unsuccessful attempts of starting a new IV. IV Team consulted for STAT placement. Will redo 15 minute vitals and restart transfusion once IV access has been re-established.

## 2021-04-26 NOTE — Progress Notes (Signed)
Patient lost PIV access while transfused blood.  Both arms are edematous and this nurse failed to put in PIV access x 2 times. Advised patient's RN to take back blood to blood bank due to no PIV access at this time. Consulted PICC nurse regarding this matter. She suggested to MD for PICC insertion since there is order for infusing potassium phosphate and other medications and blood. Informed patient's RN to contact Dr. Grandville Silos this matter. [employer]

## 2021-04-27 ENCOUNTER — Encounter (HOSPITAL_COMMUNITY): Payer: Self-pay | Admitting: Gastroenterology

## 2021-04-27 LAB — BPAM RBC
Blood Product Expiration Date: 202211212359
Blood Product Expiration Date: 202211212359
Blood Product Expiration Date: 202211212359
Blood Product Expiration Date: 202211262359
Blood Product Expiration Date: 202211272359
ISSUE DATE / TIME: 202210280011
ISSUE DATE / TIME: 202210280312
ISSUE DATE / TIME: 202210301215
ISSUE DATE / TIME: 202210301847
ISSUE DATE / TIME: 202210302134
Unit Type and Rh: 6200
Unit Type and Rh: 6200
Unit Type and Rh: 6200
Unit Type and Rh: 6200
Unit Type and Rh: 6200

## 2021-04-27 LAB — CBC WITH DIFFERENTIAL/PLATELET
Abs Immature Granulocytes: 0.03 10*3/uL (ref 0.00–0.07)
Basophils Absolute: 0 10*3/uL (ref 0.0–0.1)
Basophils Relative: 0 %
Eosinophils Absolute: 0.2 10*3/uL (ref 0.0–0.5)
Eosinophils Relative: 3 %
HCT: 26.5 % — ABNORMAL LOW (ref 39.0–52.0)
Hemoglobin: 9.2 g/dL — ABNORMAL LOW (ref 13.0–17.0)
Immature Granulocytes: 1 %
Lymphocytes Relative: 23 %
Lymphs Abs: 1.3 10*3/uL (ref 0.7–4.0)
MCH: 33.6 pg (ref 26.0–34.0)
MCHC: 34.7 g/dL (ref 30.0–36.0)
MCV: 96.7 fL (ref 80.0–100.0)
Monocytes Absolute: 0.7 10*3/uL (ref 0.1–1.0)
Monocytes Relative: 12 %
Neutro Abs: 3.5 10*3/uL (ref 1.7–7.7)
Neutrophils Relative %: 61 %
Platelets: 129 10*3/uL — ABNORMAL LOW (ref 150–400)
RBC: 2.74 MIL/uL — ABNORMAL LOW (ref 4.22–5.81)
RDW: 17.5 % — ABNORMAL HIGH (ref 11.5–15.5)
WBC: 5.7 10*3/uL (ref 4.0–10.5)
nRBC: 0 % (ref 0.0–0.2)

## 2021-04-27 LAB — TYPE AND SCREEN
ABO/RH(D): A POS
Antibody Screen: NEGATIVE
Unit division: 0
Unit division: 0
Unit division: 0
Unit division: 0
Unit division: 0

## 2021-04-27 LAB — GLUCOSE, CAPILLARY
Glucose-Capillary: 215 mg/dL — ABNORMAL HIGH (ref 70–99)
Glucose-Capillary: 201 mg/dL — ABNORMAL HIGH (ref 70–99)
Glucose-Capillary: 155 mg/dL — ABNORMAL HIGH (ref 70–99)
Glucose-Capillary: 253 mg/dL — ABNORMAL HIGH (ref 70–99)

## 2021-04-27 LAB — RENAL FUNCTION PANEL
Albumin: 2.8 g/dL — ABNORMAL LOW (ref 3.5–5.0)
Anion gap: 6 (ref 5–15)
BUN: 12 mg/dL (ref 8–23)
CO2: 32 mmol/L (ref 22–32)
Calcium: 7.1 mg/dL — ABNORMAL LOW (ref 8.9–10.3)
Chloride: 99 mmol/L (ref 98–111)
Creatinine, Ser: 0.6 mg/dL — ABNORMAL LOW (ref 0.61–1.24)
GFR, Estimated: >60 mL/min (ref >60)
Glucose, Bld: 173 mg/dL — ABNORMAL HIGH (ref 70–99)
Phosphorus: 4 mg/dL (ref 2.5–4.6)
Potassium: 2.8 mmol/L — ABNORMAL LOW (ref 3.5–5.1)
Sodium: 137 mmol/L (ref 135–145)

## 2021-04-27 LAB — MAGNESIUM: Magnesium: 1.8 mg/dL (ref 1.7–2.4)

## 2021-04-27 LAB — HEMOGLOBIN AND HEMATOCRIT, BLOOD
HCT: 29.1 % — ABNORMAL LOW (ref 39.0–52.0)
Hemoglobin: 9.7 g/dL — ABNORMAL LOW (ref 13.0–17.0)

## 2021-04-27 MED ORDER — MAGNESIUM SULFATE 4 GM/100ML IV SOLN
4.0000 g | Freq: Once | INTRAVENOUS | Status: AC
  Administered 2021-04-27: 4 g via INTRAVENOUS
  Filled 2021-04-27: qty 100

## 2021-04-27 MED ORDER — INSULIN GLARGINE-YFGN 100 UNIT/ML ~~LOC~~ SOLN
12.0000 [IU] | Freq: Every day | SUBCUTANEOUS | Status: DC
  Administered 2021-04-27: 12 [IU] via SUBCUTANEOUS
  Filled 2021-04-27 (×2): qty 0.12

## 2021-04-27 MED ORDER — POTASSIUM CHLORIDE CRYS ER 20 MEQ PO TBCR
40.0000 meq | EXTENDED_RELEASE_TABLET | ORAL | Status: AC
  Administered 2021-04-27 (×3): 40 meq via ORAL
  Filled 2021-04-27 (×3): qty 2

## 2021-04-27 MED ORDER — ENSURE ENLIVE PO LIQD
237.0000 mL | Freq: Two times a day (BID) | ORAL | Status: DC
  Administered 2021-04-27 – 2021-04-28 (×3): 237 mL via ORAL

## 2021-04-27 NOTE — Progress Notes (Signed)
PROGRESS NOTE    Joe Rivera  YNW:[id number] DOB: 10/15/1945 DOA: 04/23/2021 PCP: [facility]    No chief complaint on file.   Brief Narrative:  Patient 75 year old gentleman history of recent right total knee replacement 04/21/2021 per orthopedics, discharged home on Xarelto for DVT prophylaxis presenting to the ED with generalized weakness dizziness, shortness of breath on minimal exertion, black tarry stools.  Patient noted to have a hemoglobin of 7 from 12.5 on day of discharge on presentation to the ED.  Patient admitted due to concern for upper GI bleed.  GI consulted.  Xarelto discontinued.  Orthopedics notified of admission and consulted.   Assessment & Plan:   Principal Problem:   Upper GI bleed Active Problems:   GI bleed   Symptomatic anemia   Acute blood loss anemia   Hypokalemia   Hypomagnesemia   Hyponatremia   Type 2 diabetes mellitus without complication (HCC)   S/P TKR (total knee replacement), right   Leukocytosis   Hyperlipidemia   Hypocalcemia   Ambulatory dysfunction   Gastric ulcer: Per EDG 04/25/2021   Bilateral lower extremity edema  1 probable upper GI bleed/acute blood loss anemia/symptomatic anemia/gastric ulcers -Patient presented with symptomatic anemia after recent total right knee replacement discharged on 04/22/2021 on Xarelto for anticoagulation.  DOAC was started after discharge. -Hemoglobin on presentation was 7.0 from 12.5 on day prior to discharge. -Patient noted to be taking ibuprofen 800 mg daily for several months prior to knee replacement. -Status post transfusion 2 units packed red blood cells with posttransfusion H&H at 9.6 initially. -Patient denies any further black tarry/melanotic stools in the past 24 hours.  -Symptomatic improvement.  -Hemoglobin dropped down to 7.3 on 04/26/2021 from 8.3.   -Status post transfusion 2 units packed red blood cells 04/26/2021 with hemoglobin currently at 9.2.  -Repeat H&H this  afternoon. -Status post upper endoscopy 04/25/2021 per [name] with few nonbleeding cratered and linear gastric ulcers with no stigmata of bleeding found in the gastric antrum and in the prepyloric region of the stomach.  Biopsies pending. -Patient was on a Protonix drip and has been transitioned to Protonix 40 mg twice daily per GI with recommendations to continue for 1 month and then 40 mg daily thereafterwards.   -GI recommended no NSAIDs, no ibuprofen, no antiplatelets at this time, and if absolutely needed could retry blood thinner or antiplatelet agent in 5 days.  -Transfusion threshold hemoglobin < 7.  -GI consulted and following.   2.  Hypokalemia/hypophosphatemia -Potassium repleted however potassium at 2.8 today likely secondary to diuresis. -Phosphorus at 4.0. -Magnesium at 1.8. -KDur 40 mEq p.o. every 4 hours x3 doses. -Discontinue phosphorus supplementation..   -Magnesium sulfate 4 g IV x1. -Repeat labs in the morning.  3.  Leukocytosis -Likely reactive leukocytosis. -Patient afebrile. -Urinalysis unremarkable. -Chest x-ray no acute infiltrate. -Leukocytosis resolved. -Monitor off antibiotics.  4.  Anion gap metabolic acidosis -Serum bicarb noted at 16 with anion gap of 20 on admission. -Patient on IV fluids. -Patient given bicarb tablets 1300 mg 3 times daily x3 doses. -Bicarb currently at 32, anion gap closed at 5. -Follow.  5.  Hypovolemic hyponatremia -Improved with hydration. -Saline lock IV fluids.  6.  Recent total right knee replacement on 04/21/2021 per Dr. Alvan Dame -Continue to hold Xarelto secondary to problem #1. -SCDs for DVT prophylaxis.   -PT/OT.   -Pain management.   -Orthopedics aware of admission.    7.  Hypertension -BP noted to be soft on admission. -BP  improved with hydration.   -Saline lock IV fluids. -Continue home regimen Norvasc 5 mg daily. -Clonidine resumed. -Patient also was receiving IV Lasix. -Follow.  8.  Diabetes mellitus  type 2 -Hemoglobin A1c 6.5 (04/10/2021) -CBG 215 this morning. -Increase Semglee to 12 units daily.  Continue SSI.   -Continue to hold oral hypoglycemic agents.  9.  Hypocalcemia -25-hydroxy vitamin D level at 37.71.   -Continue Os-Cal with vitamin D.   -Outpatient follow-up.    10.  Hyperlipidemia -Continue statin  11.  Ambulatory dysfunction -PT/OT. -Fall precautions.  12.  Bilateral lower extremity edema -Likely secondary to aggressive fluid resuscitation on presentation as well as transfusion of 2 units packed red blood cells. -Patient placed on IV Lasix with urine output of 2.8 L over the past 24 hours.   13.  Hypokalemia -Secondary to diuresis. -K-Dur 40 mEq p.o. every 4 hours x3 doses -Repeat labs in the morning.   DVT prophylaxis: SCDs Code Status: Full Family Communication: Updated patient. No family at bedside. Disposition:   Status is: Inpatient  Remains inpatient appropriate because: Inpatient treatment appropriate for this patient patient presented with significant anemia with sudden drop in hemoglobin, requiring transfusion of packed red blood cells and evaluation for GI for possible EGD.     Consultants:  Gastroenterology: Dr. Michail Sermon 04/24/2021  Procedures:  Chest x-ray 04/23/2021 Transfusion 2 units packed red blood cells 04/26/2021 Transfusion 2 units packed red blood cells 04/24/2021 Upper endoscopy per Dr.Magod 04/25/2021  Antimicrobials:  None   Subjective: Sitting up in chair.  Denies any further lightheadedness and dizziness.  Overall feeling better.  Stated had a regular bowel movement this morning with some streaks of melena noted.  No chest pain.  No shortness of breath.  No coffee-ground emesis.  Tolerating current diet.  Feeling much better.  Hopeful to be discharged soon.    Objective: Vitals:   04/26/21 2159 04/26/21 2300 04/26/21 2356 04/27/21 0330  BP: 134/62 138/80 138/72 (!) 158/83  Pulse: 75 82 92 89  Resp: 20 18 18 20    Temp: 98 F (36.7 C) 98.7 F (37.1 C) 98.2 F (36.8 C) 98.1 F (36.7 C)  TempSrc: Oral Oral Oral Oral  SpO2: 98% 97% 96% 100%  Weight:      Height:        Intake/Output Summary (Last 24 hours) at 04/27/2021 1051 Last data filed at 04/27/2021 1041 Gross per 24 hour  Intake 900.36 ml  Output 2800 ml  Net -1899.64 ml    Filed Weights   04/24/21 0933 04/25/21 0932  Weight: 115.7 kg 115.7 kg    Examination:  General exam: : NAD Respiratory system: CTA B anterior lung fields.  No wheezes, no rhonchi.  Speaking in full sentences.  Normal respiratory effort. Cardiovascular system: Regular rate and rhythm no murmurs rubs or gallops.  No JVD.  Trace lower extremity edema.  Gastrointestinal system: Abdomen soft, nontender, nondistended, positive bowel sounds.  No rebound.  No guarding. Central nervous system: Alert and oriented. No focal neurological deficits. Extremities: Symmetric 5 x 5 power. Skin: No rashes, lesions or ulcers Psychiatry: Judgement and insight appear normal. Mood & affect appropriate.   Data Reviewed: I have personally reviewed following labs and imaging studies  CBC: Recent Labs  Lab 04/23/21 2222 04/24/21 0500 04/24/21 1100 04/24/21 1816 04/25/21 0850 04/25/21 2002 04/26/21 0538 04/27/21 0324  WBC 14.4* 16.0*  --   --  9.7 7.2 5.1 5.7  NEUTROABS 9.7*  --   --   --  6.9 4.9  --  3.5  HGB 7.0* 9.6*   < > 8.3* 8.3* 7.5* 7.3* 9.2*  HCT 20.3* 27.0*   < > 23.5* 23.6* 22.0* 20.9* 26.5*  MCV 102.5* 93.8  --   --  95.9 100.0 97.7 96.7  PLT 200 179  --   --  143* 135* 130* 129*   < > = values in this interval not displayed.     Basic Metabolic Panel: Recent Labs  Lab 04/23/21 2222 04/24/21 0500 04/25/21 0301 04/26/21 0538 04/27/21 0324  NA 131* 134* 137 139 137  K 2.9* 3.1* 3.1* 3.3* 2.8*  CL 95* 98 103 106 99  CO2 16* 18* 26 28 32  GLUCOSE 251* 231* 170* 190* 173*  BUN 42* 47* 27* 14 12  CREATININE 0.97 0.97 0.65 0.61 0.60*  CALCIUM 7.3*  7.3* 7.3* 7.2* 7.1*  MG 2.8* 2.5* 2.2 1.8 1.8  PHOS  --  2.1* 2.0* 2.0* 4.0     GFR: Estimated Creatinine Clearance: 105.6 mL/min (A) (by C-G formula based on SCr of 0.6 mg/dL (L)).  Liver Function Tests: Recent Labs  Lab 04/23/21 2222 04/24/21 0500 04/25/21 0301 04/26/21 0538 04/27/21 0324  AST 30 31  --   --   --   ALT 19 23  --   --   --   ALKPHOS 39 40  --   --   --   BILITOT 0.8 1.2  --   --   --   PROT 5.2* 5.7*  --   --   --   ALBUMIN 2.5* 2.9* 3.0* 2.7* 2.8*     CBG: Recent Labs  Lab 04/26/21 0734 04/26/21 1200 04/26/21 1601 04/26/21 2024 04/27/21 0818  GLUCAP 191* 208* 146* 201* 215*      Recent Results (from the past 240 hour(s))  Resp Panel by RT-PCR (Flu A&B, Covid) Nasopharyngeal Swab     Status: None   Collection Time: 04/23/21 10:22 PM   Specimen: Nasopharyngeal Swab; Nasopharyngeal(NP) swabs in vial transport medium  Result Value Ref Range Status   SARS Coronavirus 2 by RT PCR NEGATIVE NEGATIVE Final    Comment: (NOTE) SARS-CoV-2 target nucleic acids are NOT DETECTED.  The SARS-CoV-2 RNA is generally detectable in upper respiratory specimens during the acute phase of infection. The lowest concentration of SARS-CoV-2 viral copies this assay can detect is 138 copies/mL. A negative result does not preclude SARS-Cov-2 infection and should not be used as the sole basis for treatment or other patient management decisions. A negative result may occur with  improper specimen collection/handling, submission of specimen other than nasopharyngeal swab, presence of viral mutation(s) within the areas targeted by this assay, and inadequate number of viral copies(<138 copies/mL). A negative result must be combined with clinical observations, patient history, and epidemiological information. The expected result is Negative.  Fact Sheet for Patients:  EntrepreneurPulse.com.au  Fact Sheet for Healthcare Providers:   IncredibleEmployment.be  This test is no t yet approved or cleared by the [place] FDA and  has been authorized for detection and/or diagnosis of SARS-CoV-2 by FDA under an Emergency Use Authorization (EUA). This EUA will remain  in effect (meaning this test can be used) for the duration of the COVID-19 declaration under Section 564(b)(1) of the Act, 21 U.S.C.section 360bbb-3(b)(1), unless the authorization is terminated  or revoked sooner.       Influenza A by PCR NEGATIVE NEGATIVE Final   Influenza B by PCR NEGATIVE NEGATIVE Final    Comment: (  NOTE) The Xpert Xpress SARS-CoV-2/FLU/RSV plus assay is intended as an aid in the diagnosis of influenza from Nasopharyngeal swab specimens and should not be used as a sole basis for treatment. Nasal washings and aspirates are unacceptable for Xpert Xpress SARS-CoV-2/FLU/RSV testing.  Fact Sheet for Patients: EntrepreneurPulse.com.au  Fact Sheet for Healthcare Providers: IncredibleEmployment.be  This test is not yet approved or cleared by the [place] FDA and has been authorized for detection and/or diagnosis of SARS-CoV-2 by FDA under an Emergency Use Authorization (EUA). This EUA will remain in effect (meaning this test can be used) for the duration of the COVID-19 declaration under Section 564(b)(1) of the Act, 21 U.S.C. section 360bbb-3(b)(1), unless the authorization is terminated or revoked.  Performed at [facility], [place] [address] [place], [place] [address]           Radiology Studies: [place] EKG SITE RITE  Result Date: 04/26/2021 If Site Rite image not attached, placement could not be confirmed due to current cardiac rhythm.       Scheduled Meds:  amLODipine  5 mg Oral Daily   atorvastatin  20 mg Oral QHS   calcium-vitamin D  1 tablet Oral TID   Chlorhexidine Gluconate Cloth  6 each Topical Daily   cloNIDine  0.1 mg Oral BID    feeding supplement  237 mL Oral BID BM   furosemide  40 mg Intravenous Q12H   insulin aspart  0-20 Units Subcutaneous TID WC   insulin glargine-yfgn  12 Units Subcutaneous QHS   pantoprazole  40 mg Oral BID AC   potassium chloride  40 mEq Oral Q4H   sodium chloride flush  10-40 mL Intracatheter Q12H   Continuous Infusions:  magnesium sulfate bolus IVPB        LOS: 4 days    Time spent: 40 minutes    Irine Seal, MD [facility]   To contact the attending provider between 7A-7P or the covering provider during after hours 7P-7A, please log into the web site [email or web address] and access using universal [place] password for that web site. If you do not have the password, please call the hospital operator.  04/27/2021, 10:51 AM

## 2021-04-27 NOTE — Progress Notes (Signed)
Physical Therapy Treatment Patient Details Name: Joe Rivera MRN: [id number] DOB: 04/19/1946 Today's Date: 04/27/2021   History of Present Illness Patient is 75 y.o. male s/p Rt TKA on 10/25/2 with PMH significant for OA, DM, gout, HTN, anxiety, DVT, ACDF.  Pt readmitted 04/23/21 following syncopal episode at home and with HGB 7.0 - pt dx with GIB.  Pt recieved 2 units RBC and now HGB 9.2    PT Comments    Patient  mobilizing well, Right knee flexion ~ 110. Patient progressing well.   Recommendations for follow up therapy are one component of a multi-disciplinary discharge planning process, led by the attending physician.  Recommendations may be updated based on patient status, additional functional criteria and insurance authorization.  Follow Up Recommendations  Outpatient PT     Assistance Recommended at Discharge Intermittent Supervision/Assistance  Equipment Recommendations  None recommended by PT    Recommendations for Other Services       Precautions / Restrictions Precautions Precautions: Knee;Fall     Mobility  Bed Mobility Overal bed mobility: Independent                  Transfers Overall transfer level: Modified independent Equipment used: Rolling walker (2 wheels)               General transfer comment: has been up adlib    Ambulation/Gait Ambulation/Gait assistance: Supervision Gait Distance (Feet): 50 Feet Assistive device: Rolling walker (2 wheels) Gait Pattern/deviations: Step-through pattern;Decreased step length - right         Stairs             Wheelchair Mobility    Modified Rankin (Stroke Patients Only)       Balance Overall balance assessment: Modified Independent                                          Cognition                                                Exercises Total Joint Exercises Long Arc Quad: AROM;Right;Seated Knee Flexion:  AROM;Right;Seated Goniometric ROM: 0-110    General Comments        Pertinent Vitals/Pain Pain Score: 1  Pain Location: Rt knee Pain Descriptors / Indicators: Discomfort Pain Intervention(s): Monitored during session    Home Living                          Prior Function            PT Goals (current goals can now be found in the care plan section) Progress towards PT goals: Progressing toward goals    Frequency    Min 3X/week      PT Plan Current plan remains appropriate    Co-evaluation              AM-PAC PT "6 Clicks" Mobility   Outcome Measure  Help needed turning from your back to your side while in a flat bed without using bedrails?: A Little Help needed moving from lying on your back to sitting on the side of a flat bed without using bedrails?: A Little Help needed moving to and from a bed to a chair (including  a wheelchair)?: A Little Help needed standing up from a chair using your arms (e.g., wheelchair or bedside chair)?: A Little Help needed to walk in hospital room?: A Little Help needed climbing 3-5 steps with a railing? : A Little 6 Click Score: 18    End of Session   Activity Tolerance: Patient tolerated treatment well;Patient limited by fatigue Patient left: Other (comment) (window seat) Nurse Communication: Mobility status PT Visit Diagnosis: Muscle weakness (generalized) (M62.81);Difficulty in walking, not elsewhere classified (R26.2)     Time: 1542-1600 PT Time Calculation (min) (ACUTE ONLY): 18 min  Charges:  $Gait Training: 8-22 mins                     Joe Rivera [phone] Office [phone]    Joe Rivera 04/27/2021, 4:20 PM

## 2021-04-28 ENCOUNTER — Encounter (HOSPITAL_COMMUNITY): Payer: Self-pay | Admitting: Orthopedic Surgery

## 2021-04-28 DIAGNOSIS — R600 Localized edema: Secondary | ICD-10-CM

## 2021-04-28 DIAGNOSIS — K253 Acute gastric ulcer without hemorrhage or perforation: Secondary | ICD-10-CM

## 2021-04-28 LAB — BASIC METABOLIC PANEL
Anion gap: 6 (ref 5–15)
BUN: 13 mg/dL (ref 8–23)
CO2: 31 mmol/L (ref 22–32)
Calcium: 8.3 mg/dL — ABNORMAL LOW (ref 8.9–10.3)
Chloride: 101 mmol/L (ref 98–111)
Creatinine, Ser: 0.67 mg/dL (ref 0.61–1.24)
GFR, Estimated: >60 mL/min (ref >60)
Glucose, Bld: 169 mg/dL — ABNORMAL HIGH (ref 70–99)
Potassium: 3.2 mmol/L — ABNORMAL LOW (ref 3.5–5.1)
Sodium: 138 mmol/L (ref 135–145)

## 2021-04-28 LAB — CBC
HCT: 27.1 % — ABNORMAL LOW (ref 39.0–52.0)
Hemoglobin: 9.1 g/dL — ABNORMAL LOW (ref 13.0–17.0)
MCH: 33.1 pg (ref 26.0–34.0)
MCHC: 33.6 g/dL (ref 30.0–36.0)
MCV: 98.5 fL (ref 80.0–100.0)
Platelets: 136 10*3/uL — ABNORMAL LOW (ref 150–400)
RBC: 2.75 MIL/uL — ABNORMAL LOW (ref 4.22–5.81)
RDW: 17.7 % — ABNORMAL HIGH (ref 11.5–15.5)
WBC: 5.6 10*3/uL (ref 4.0–10.5)
nRBC: 0 % (ref 0.0–0.2)

## 2021-04-28 LAB — GLUCOSE, CAPILLARY
Glucose-Capillary: 177 mg/dL — ABNORMAL HIGH (ref 70–99)
Glucose-Capillary: 189 mg/dL — ABNORMAL HIGH (ref 70–99)

## 2021-04-28 LAB — MAGNESIUM: Magnesium: 2 mg/dL (ref 1.7–2.4)

## 2021-04-28 MED ORDER — POLYETHYLENE GLYCOL 3350 17 G PO PACK: 17.0000 g | PACK | Freq: Every day | ORAL | Status: DC | PRN

## 2021-04-28 MED ORDER — PANTOPRAZOLE SODIUM 40 MG PO TBEC: 40.0000 mg | DELAYED_RELEASE_TABLET | Freq: Two times a day (BID) | ORAL | 1 refills | Status: AC

## 2021-04-28 MED ORDER — POTASSIUM CHLORIDE 20 MEQ PO PACK
40.0000 meq | PACK | ORAL | Status: AC
Start: 2021-04-28 — End: 2021-04-28
  Administered 2021-04-28 (×2): 40 meq via ORAL
  Filled 2021-04-28 (×2): qty 2

## 2021-04-28 MED ORDER — INDAPAMIDE 1.25 MG PO TABS
2.5000 mg | ORAL_TABLET | Freq: Every day | ORAL | Status: DC
  Administered 2021-04-28: 2.5 mg via ORAL
  Filled 2021-04-28: qty 2

## 2021-04-28 MED ORDER — OYSTER SHELL CALCIUM/D3 500-5 MG-MCG PO TABS: 1.0000 | ORAL_TABLET | Freq: Three times a day (TID) | ORAL | Status: AC

## 2021-04-28 MED ORDER — METOPROLOL TARTRATE 12.5 MG HALF TABLET
12.5000 mg | ORAL_TABLET | Freq: Two times a day (BID) | ORAL | Status: DC
  Administered 2021-04-28: 12.5 mg via ORAL
  Filled 2021-04-28: qty 1

## 2021-04-28 NOTE — Progress Notes (Signed)
Pt discharged home. AVS printed and educational teaching completed with teach back method. "Ask Me 3" utilized. PICC line removed by IV therapy; dressing clean and intact. VSS. Pt received second order of  KLOR-CON @ 1307 (see MAR). Pt transported to lobby via wheelchair with all belongings.

## 2021-04-28 NOTE — Discharge Summary (Signed)
Physician Discharge Summary  Joe Rivera VEL:[id number] DOB: 1945-11-27 DOA: 04/23/2021  PCP: [facility]  Admit date: 04/23/2021 Discharge date: 04/28/2021  Time spent: 55 minutes  Recommendations for Outpatient Follow-up:  Follow up with [name] in 1-2 weeks. On follow up will need BMET/Phos/CBC. HTN will need to be followed up on. Follow up with [name], GI in 4 weeks. Follow up with [name], orthopedics as scheduled.    Discharge Diagnoses:  Principal Problem:   Upper GI bleed Active Problems:   GI bleed   Symptomatic anemia   Acute blood loss anemia   Hypokalemia   Hypomagnesemia   Hyponatremia   Type 2 diabetes mellitus without complication (HCC)   S/P TKR (total knee replacement), right   Leukocytosis   Hyperlipidemia   Hypocalcemia   Ambulatory dysfunction   Gastric ulcer: Per EDG 04/25/2021   Bilateral lower extremity edema   Discharge Condition: Stable and improved  Diet recommendation: Carb modified diet  Filed Weights   04/24/21 0933 04/25/21 0932  Weight: 115.7 kg 115.7 kg    History of present illness:  HPI per Dr. Sharin Rivera is a 75 y.o. male with medical history significant for recent right total knee replacement on 04/21/2021 by orthopedic surgery, Dr. Alvan Dame, admitted by the orthopedic service and discharged on 04/22/2021 on Xarelto for DVT prophylaxis.  He was doing fine until today.  Reports generalized weakness and dizziness all day since about noon.  Associated with dyspnea with minimal exertion, palpitations and 1 episode of black tarry stools.  He had not had a bowel movement in 3 days so he took 2 doses of milk of magnesia and had a large bowel movement, stools were dark initially then brown towards the end.  No abdominal pain or nausea.  Prior to surgery he was taking ibuprofen which he stopped taking.  He went back to bed, felt like he was going to have another bowel movement, sat up in bed to go to the  bathroom, then passed out.  EMS was activated.  He was brought to [facility] ED for further evaluation and management.   Work-up in the ED revealed drop in hemoglobin 7.0 K from 12.5 K on the day of discharge.  Due to concern for upper GI bleed patient was started on Protonix drip in the ED.  2 units PRBCs were ordered to be transfused by EDP.  GI consulted.  Patient made n.p.o. after midnight for possible EGD in the morning.  Patient admitted by hospitalist service, [facility].   ED Course: Temperature 96.8.  BP 108/69, pulse 97, respiration rate 18, O2 saturation 100% on 4 L.  Lab studies remarkable for serum sodium 131, potassium 2.9, serum bicarb 16, glucose 251, BUN 42, creatinine 0.97, anion gap 20, magnesium 2.8, hemoglobin 7.0 from 12.5 on 04/22/2021.  MCV 102.5.  WBC 14.4.  Hospital Course:  1 Upper GI bleed/acute blood loss anemia/symptomatic anemia/gastric ulcers -Patient presented with symptomatic anemia after recent total right knee replacement discharged on 04/22/2021 on Xarelto for anticoagulation.  DOAC was started after discharge. -Hemoglobin on presentation was 7.0 from 12.5 on day prior to discharge. -Patient noted to be taking ibuprofen 800 mg daily for several months prior to knee replacement. -Status post transfusion 4 units packed red blood cells during the hospitalization with stabilization of hemoglobin at 9.1 by day of discharge. -Status post upper endoscopy 04/25/2021 per [name] with few nonbleeding cratered and linear gastric ulcers with no stigmata of bleeding found  in the gastric antrum and in the prepyloric region of the stomach.  Biopsies pending. -Patient was on a Protonix drip and transitioned to Protonix 40 mg twice daily per GI with recommendations to continue for 1 month and then 40 mg daily thereafterwards.   -GI recommended no NSAIDs, no ibuprofen, no antiplatelets at this time, and if absolutely needed could retry blood thinner or antiplatelet agent in 5 days.  -Patient  will be called by GI with follow-up on biopsy results. -Outpatient follow-up with GI in 4 weeks.  2.  Hypokalemia/hypophosphatemia -Potassium repleted and phosphorus repleted during the hospitalization with electrolyte abnormalities felt secondary to diuresis.   -Outpatient follow-up with PCP.  3.  Leukocytosis -Likely reactive leukocytosis. -Patient afebrile. -Urinalysis unremarkable. -Chest x-ray no acute infiltrate. -Leukocytosis resolved.  4.  Anion gap metabolic acidosis -Serum bicarb noted at 16 with anion gap of 20 on admission. -Patient was hydrated with IV fluids.   -Patient also placed on bicarb tablets.   -Anion gap metabolic acidosis had resolved by day of discharge.  5.  Hypovolemic hyponatremia -Improved with hydration.  6.  Recent total right knee replacement on 04/21/2021 per Dr. Alvan Dame -Xarelto was discontinued secondary to problem #1.  -SCDs for DVT prophylaxis.   -PT/OT.   -Pain management.   -Orthopedics alerted of patient's admission. -Outpatient follow-up as previously scheduled.  7.  Hypertension -BP noted to be soft on admission. -BP improved with hydration.   -Saline lock IV fluids. -Patient's home regimen Norvasc of 5 mg daily resumed as well as clonidine and beta-blocker.   -Outpatient follow-up with PCP.  8.  Diabetes mellitus type 2 -Hemoglobin A1c 6.5 (04/10/2021) -Patient maintained on Semglee as well as sliding scale insulin during the hospitalization.   -Oral hypoglycemic agents were held during the hospitalization will be resumed on discharge.  9.  Hypocalcemia -25-hydroxy vitamin D level at 37.71.   -Patient placed on Os-Cal with vitamin D.   -Outpatient follow-up.    10.  Hyperlipidemia -Patient maintained on statin.  11.  Ambulatory dysfunction -PT/OT. -Fall precautions. -Outpatient PT.  12.  Bilateral lower extremity edema -Likely secondary to aggressive fluid resuscitation on presentation as well as transfusion of packed  red blood cells. -Patient diuresed with IV Lasix with good urine output and significant improvement with bilateral lower extremity edema.  13.  Hypokalemia -Secondary to diuresis. -Repleted during the hospitalization.   -Outpatient follow-up with PCP.     Procedures: Chest x-ray 04/23/2021 Transfusion 2 units packed red blood cells 04/26/2021 Transfusion 2 units packed red blood cells 04/24/2021 Upper endoscopy per Dr.Magod 04/25/2021    Consultations: Gastroenterology: Dr. Michail Sermon 04/24/2021    Discharge Exam: Vitals:   04/28/21 0830 04/28/21 0852  BP: (!) 147/85 (!) 153/77  Pulse: (!) 111 (!) 101  Resp: 18 19  Temp: 97.8 F (36.6 C) 98.1 F (36.7 C)  SpO2: 97% 99%    General: NAD Cardiovascular: Regular rate rhythm no murmurs rubs or gallops.  No JVD.  Trace to 1+ right lower extremity edema.  Trace left lower extremity edema. Respiratory: Clear to auscultation bilaterally.  No wheezes, no crackles, no rhonchi.  Discharge Instructions   Discharge Instructions     Ambulatory referral to Physical Therapy   Complete by: As directed    Diet Carb Modified   Complete by: As directed    Discharge instructions   Complete by: As directed    No aspirin, no ibuprofen, no naproxen, no goodys powder, no NSAIDS, no antiplatelets   Discharge  wound care:   Complete by: As directed    As ABOVE   Discharge wound care:   Complete by: As directed    As above   Increase activity slowly   Complete by: As directed    Increase activity slowly   Complete by: As directed       Allergies as of 04/28/2021       Reactions   Lisinopril Swelling   Tongue        Medication List     STOP taking these medications    rivaroxaban 10 MG Tabs tablet Commonly known as: XARELTO       TAKE these medications    acetaminophen 325 MG tablet Commonly known as: TYLENOL Take 3 tablets (975 mg total) by mouth every 8 (eight) hours.   allopurinol 300 MG tablet Commonly known  as: ZYLOPRIM Take 300 mg by mouth daily as needed. For gout   amLODipine 5 MG tablet Commonly known as: NORVASC Take 5 mg by mouth daily.   atorvastatin 20 MG tablet Commonly known as: LIPITOR Take 20 mg by mouth at bedtime.   calcium-vitamin D 500MG -200UNIT (5MCG) tablet Commonly known as: OSCAL WITH D Take 1 tablet by mouth 3 (three) times daily.   cloNIDine 0.1 MG tablet Commonly known as: CATAPRES Take 0.1 mg by mouth 2 (two) times daily.   diphenhydrAMINE 50 MG tablet Commonly known as: BENADRYL Take 50 mg by mouth at bedtime as needed for sleep.   docusate sodium 100 MG capsule Commonly known as: COLACE Take 1 capsule (100 mg total) by mouth 2 (two) times daily.   DRY EYES OP Place 1 drop into both eyes daily.   indapamide 2.5 MG tablet Commonly known as: LOZOL Take 2.5 mg by mouth daily.   metFORMIN 500 MG tablet Commonly known as: GLUCOPHAGE Take 1,000 mg by mouth at bedtime.   methocarbamol 500 MG tablet Commonly known as: ROBAXIN Take 1 tablet (500 mg total) by mouth every 6 (six) hours as needed for muscle spasms.   metoprolol tartrate 50 MG tablet Commonly known as: LOPRESSOR Take 50 mg by mouth 2 (two) times daily.   oxyCODONE 5 MG immediate release tablet Commonly known as: Oxy IR/ROXICODONE Take 1-2 tablets (5-10 mg total) by mouth every 4 (four) hours as needed for severe pain.   pantoprazole 40 MG tablet Commonly known as: PROTONIX Take 1 tablet (40 mg total) by mouth 2 (two) times daily before a meal. 1 tablet (40mg ) by mouth 2 times daily x1 month, then 1 tablet daily.   polyethylene glycol 17 g packet Commonly known as: MIRALAX / GLYCOLAX Take 17 g by mouth daily as needed for mild constipation.               Discharge Care Instructions  (From admission, onward)           Start     Ordered   04/28/21 0000  Discharge wound care:       Comments: As ABOVE   04/28/21 1035   04/28/21 0000  Discharge wound care:        Comments: As above   04/28/21 1050           Allergies  Allergen Reactions   Lisinopril Swelling    Tongue    Follow-up Information     [facility]. Schedule an appointment as soon as possible for a visit in 2 week(s).   Why: Follow-up in 1 to 2 weeks. Contact information: [id number]  [place] [address] [phone]         Clarene Essex, MD. Schedule an appointment as soon as possible for a visit in 1 month(s).   Specialty: Gastroenterology Contact information: [address] [place] [address] [phone]         Paralee Cancel, MD Follow up.   Specialty: Orthopedic Surgery Why: F/U AS SCHEDULED. Contact information: [address] [place] [address] [phone]                  The results of significant diagnostics from this hospitalization (including imaging, microbiology, ancillary and laboratory) are listed below for reference.    Significant Diagnostic Studies: DG Chest Port 1 View  Result Date: 04/23/2021 CLINICAL DATA:  Shortness of breath. EXAM: PORTABLE CHEST 1 VIEW COMPARISON:  Chest x-ray 05/04/2011. FINDINGS: Patient is rotated. Cardiomediastinal silhouette is within normal limits for degree of rotation. The lungs and costophrenic angles are clear. There is no pneumothorax or acute fracture. IMPRESSION: No active disease. Electronically Signed   By: Ronney Asters M.D.   On: 04/23/2021 22:32   [place] EKG SITE RITE  Result Date: 04/26/2021 If Site Rite image not attached, placement could not be confirmed due to current cardiac rhythm.   Microbiology: Recent Results (from the past 240 hour(s))  Resp Panel by RT-PCR (Flu A&B, Covid) Nasopharyngeal Swab     Status: None   Collection Time: 04/23/21 10:22 PM   Specimen: Nasopharyngeal Swab; Nasopharyngeal(NP) swabs in vial transport medium  Result Value Ref Range Status   SARS Coronavirus 2 by RT PCR NEGATIVE NEGATIVE  Final    Comment: (NOTE) SARS-CoV-2 target nucleic acids are NOT DETECTED.  The SARS-CoV-2 RNA is generally detectable in upper respiratory specimens during the acute phase of infection. The lowest concentration of SARS-CoV-2 viral copies this assay can detect is 138 copies/mL. A negative result does not preclude SARS-Cov-2 infection and should not be used as the sole basis for treatment or other patient management decisions. A negative result may occur with  improper specimen collection/handling, submission of specimen other than nasopharyngeal swab, presence of viral mutation(s) within the areas targeted by this assay, and inadequate number of viral copies(<138 copies/mL). A negative result must be combined with clinical observations, patient history, and epidemiological information. The expected result is Negative.  Fact Sheet for Patients:  EntrepreneurPulse.com.au  Fact Sheet for Healthcare Providers:  IncredibleEmployment.be  This test is no t yet approved or cleared by the [place] FDA and  has been authorized for detection and/or diagnosis of SARS-CoV-2 by FDA under an Emergency Use Authorization (EUA). This EUA will remain  in effect (meaning this test can be used) for the duration of the COVID-19 declaration under Section 564(b)(1) of the Act, 21 U.S.C.section 360bbb-3(b)(1), unless the authorization is terminated  or revoked sooner.       Influenza A by PCR NEGATIVE NEGATIVE Final   Influenza B by PCR NEGATIVE NEGATIVE Final    Comment: (NOTE) The Xpert Xpress SARS-CoV-2/FLU/RSV plus assay is intended as an aid in the diagnosis of influenza from Nasopharyngeal swab specimens and should not be used as a sole basis for treatment. Nasal washings and aspirates are unacceptable for Xpert Xpress SARS-CoV-2/FLU/RSV testing.  Fact Sheet for Patients: EntrepreneurPulse.com.au  Fact Sheet for Healthcare  Providers: IncredibleEmployment.be  This test is not yet approved or cleared by the [place] FDA and has been authorized for detection and/or diagnosis of SARS-CoV-2 by FDA under an  Emergency Use Authorization (EUA). This EUA will remain in effect (meaning this test can be used) for the duration of the COVID-19 declaration under Section 564(b)(1) of the Act, 21 U.S.C. section 360bbb-3(b)(1), unless the authorization is terminated or revoked.  Performed at [facility], [place] [address], [place], [place] [address]      Labs: Basic Metabolic Panel: Recent Labs  Lab 04/24/21 0500 04/25/21 0301 04/26/21 0538 04/27/21 0324 04/28/21 0421  NA 134* 137 139 137 138  K 3.1* 3.1* 3.3* 2.8* 3.2*  CL 98 103 106 99 101  CO2 18* 26 28 32 31  GLUCOSE 231* 170* 190* 173* 169*  BUN 47* 27* 14 12 13   CREATININE 0.97 0.65 0.61 0.60* 0.67  CALCIUM 7.3* 7.3* 7.2* 7.1* 8.3*  MG 2.5* 2.2 1.8 1.8 2.0  PHOS 2.1* 2.0* 2.0* 4.0  --    Liver Function Tests: Recent Labs  Lab 04/23/21 2222 04/24/21 0500 04/25/21 0301 04/26/21 0538 04/27/21 0324  AST 30 31  --   --   --   ALT 19 23  --   --   --   ALKPHOS 39 40  --   --   --   BILITOT 0.8 1.2  --   --   --   PROT 5.2* 5.7*  --   --   --   ALBUMIN 2.5* 2.9* 3.0* 2.7* 2.8*   No results for input(s): LIPASE, AMYLASE in the last 168 hours. No results for input(s): AMMONIA in the last 168 hours. CBC: Recent Labs  Lab 04/23/21 2222 04/24/21 0500 04/25/21 0850 04/25/21 2002 04/26/21 0538 04/27/21 0324 04/27/21 1534 04/28/21 0421  WBC 14.4*   < > 9.7 7.2 5.1 5.7  --  5.6  NEUTROABS 9.7*  --  6.9 4.9  --  3.5  --   --   HGB 7.0*   < > 8.3* 7.5* 7.3* 9.2* 9.7* 9.1*  HCT 20.3*   < > 23.6* 22.0* 20.9* 26.5* 29.1* 27.1*  MCV 102.5*   < > 95.9 100.0 97.7 96.7  --  98.5  PLT 200   < > 143* 135* 130* 129*  --  136*   < > = values in this interval not displayed.   Cardiac Enzymes: No results for  input(s): CKTOTAL, CKMB, CKMBINDEX, TROPONINI in the last 168 hours. BNP: BNP (last 3 results) No results for input(s): BNP in the last 8760 hours.  ProBNP (last 3 results) No results for input(s): PROBNP in the last 8760 hours.  CBG: Recent Labs  Lab 04/27/21 0818 04/27/21 1209 04/27/21 1745 04/27/21 2008 04/28/21 0746  GLUCAP 215* 201* 155* 253* 177*       Signed:  Irine Seal MD.  [facility] 04/28/2021, 10:51 AM

## 2021-04-28 NOTE — Progress Notes (Signed)
Sent message, via epic in basket, requesting orders in epic from surgeon.  

## 2021-04-29 LAB — SURGICAL PATHOLOGY

## 2021-04-30 NOTE — Discharge Summary (Signed)
Physician Discharge Summary   Patient ID: Joe Rivera MRN: [id number] DOB/AGE: 1945-07-28 75 y.o.  Admit date: 04/21/2021 Discharge date: 04/22/2021  Primary Diagnosis: Right knee osteoarthritis.   Admission Diagnoses:  Past Medical History:  Diagnosis Date   Anxiety    does not take medication for   Arthritis    Cervical radicular pain    has pain in right arm   Deep vein thrombophlebitis of right leg ([place])    1991 or 1992   Diabetes mellitus without complication ([place])    Gout    history of, takes allopurinol as needed   Hypertension    Discharge Diagnoses:   Active Problems:   S/P total knee arthroplasty, right  Estimated body mass index is 36.35 kg/m as calculated from the following:   Height as of this encounter: 5' 10.5" (1.791 m).   Weight as of this encounter: 116.6 kg.  Procedure:  Procedure(s) (LRB): TOTAL KNEE ARTHROPLASTY (Right)   Consults: None  HPI:  Yonatan Guitron is a 75 y.o. male patient of   mine.  The patient had been seen, evaluated, and treated for months conservatively in the   office with medication, activity modification, and injections.  The patient had   radiographic changes of bone-on-bone arthritis with endplate sclerosis and osteophytes noted.  Based on the radiographic changes and failed conservative measures, the patient   decided to proceed with definitive treatment, total knee replacement.  Risks of infection, DVT, component failure, need for revision surgery, neurovascular injury were reviewed in the office setting.  The postop course was reviewed stressing the efforts to maximize post-operative satisfaction and function.  Consent was obtained for benefit of pain   relief.   Laboratory Data: Admission on 04/21/2021, Discharged on 04/22/2021  Component Date Value Ref Range Status   Glucose-Capillary 04/21/2021 158 (A)  70 - 99 mg/dL Final   Glucose reference range applies only to samples taken after fasting for at least 8 hours.    Comment 1 04/21/2021 Notify RN   Final   Comment 2 04/21/2021 Document in Chart   Final   Glucose-Capillary 04/21/2021 149 (A)  70 - 99 mg/dL Final   Glucose reference range applies only to samples taken after fasting for at least 8 hours.   Glucose-Capillary 04/21/2021 161 (A)  70 - 99 mg/dL Final   Glucose reference range applies only to samples taken after fasting for at least 8 hours.   WBC 04/22/2021 11.4 (A)  4.0 - 10.5 K/uL Final   RBC 04/22/2021 3.56 (A)  4.22 - 5.81 MIL/uL Final   Hemoglobin 04/22/2021 12.5 (A)  13.0 - 17.0 g/dL Final   HCT 04/22/2021 35.1 (A)  39.0 - 52.0 % Final   MCV 04/22/2021 98.6  80.0 - 100.0 fL Final   MCH 04/22/2021 35.1 (A)  26.0 - 34.0 pg Final   MCHC 04/22/2021 35.6  30.0 - 36.0 g/dL Final   RDW 04/22/2021 11.9  11.5 - 15.5 % Final   Platelets 04/22/2021 142 (A)  150 - 400 K/uL Final   nRBC 04/22/2021 0.0  0.0 - 0.2 % Final   Performed at [facility], [place] [address] [place], [place] [address]   Sodium 04/22/2021 130 (A)  135 - 145 mmol/L Final   Potassium 04/22/2021 3.6  3.5 - 5.1 mmol/L Final   Chloride 04/22/2021 94 (A)  98 - 111 mmol/L Final   CO2 04/22/2021 29  22 - 32 mmol/L Final   Glucose, Bld 04/22/2021 269 (A)  70 - 99 mg/dL Final   Glucose reference range applies only to samples taken after fasting for at least 8 hours.   BUN 04/22/2021 14  8 - 23 mg/dL Final   Creatinine, Ser 04/22/2021 0.81  0.61 - 1.24 mg/dL Final   Calcium 04/22/2021 8.1 (A)  8.9 - 10.3 mg/dL Final   GFR, Estimated 04/22/2021 >60  >60 mL/min Final   Comment: (NOTE) Calculated using the CKD-EPI Creatinine Equation (2021)    Anion gap 04/22/2021 7  5 - 15 Final   Performed at [facility], [place] [address] [place],  [address]   Glucose-Capillary 04/21/2021 359 (A)  70 - 99 mg/dL Final   Glucose reference range applies only to samples taken after fasting for at least 8 hours.   Glucose-Capillary 04/22/2021 218 (A)  70 -  99 mg/dL Final   Glucose reference range applies only to samples taken after fasting for at least 8 hours.   Glucose-Capillary 04/22/2021 215 (A)  70 - 99 mg/dL Final   Glucose reference range applies only to samples taken after fasting for at least 8 hours.   Glucose-Capillary 04/21/2021 281 (A)  70 - 99 mg/dL Final   Glucose reference range applies only to samples taken after fasting for at least 8 hours.  Orders Only on 04/17/2021  Component Date Value Ref Range Status   SARS Coronavirus 2 04/17/2021 RESULT: NEGATIVE   Final   Comment: RESULT: NEGATIVESARS-CoV-2 INTERPRETATION:A NEGATIVE  test result means that SARS-CoV-2 RNA was not present in the specimen above the limit of detection of this test. This does not preclude a possible SARS-CoV-2 infection and should not be used as the  sole basis for patient management decisions. Negative results must be combined with clinical observations, patient history, and epidemiological information. Optimum specimen types and timing for peak viral levels during infections caused by SARS-CoV-2  have not been determined. Collection of multiple specimens or types of specimens may be necessary to detect virus. Improper specimen collection and handling, sequence variability under primers/probes, or organism present below the limit of detection may  lead to false negative results. Positive and negative predictive values of testing are highly dependent on prevalence. False negative test results are more likely when prevalence of disease is high.The expected result is NEGATIVE.Fact S                          heet for  Healthcare Providers: LocalChronicle.no Sheet for Patients: SalonLookup.es Reference Range - Negative   Hospital Outpatient Visit on 04/10/2021  Component Date Value Ref Range Status   MRSA, PCR 04/10/2021 NEGATIVE  NEGATIVE Final   Staphylococcus aureus 04/10/2021 POSITIVE (A)  NEGATIVE  Final   Comment: (NOTE) The Xpert SA Assay (FDA approved for NASAL specimens in patients 1 years of age and older), is one component of a comprehensive surveillance program. It is not intended to diagnose infection nor to guide or monitor treatment. Performed at [facility], [place] [address], [place], [place] [address]    WBC 04/10/2021 7.5  4.0 - 10.5 K/uL Final   RBC 04/10/2021 4.39  4.22 - 5.81 MIL/uL Final   Hemoglobin 04/10/2021 15.4  13.0 - 17.0 g/dL Final   HCT 04/10/2021 42.3  39.0 - 52.0 % Final   MCV 04/10/2021 96.4  80.0 - 100.0 fL Final   MCH 04/10/2021 35.1 (A)  26.0 - 34.0 pg Final   MCHC 04/10/2021 36.4 (A)  30.0 -  36.0 g/dL Final   RDW 04/10/2021 11.9  11.5 - 15.5 % Final   Platelets 04/10/2021 168  150 - 400 K/uL Final   nRBC 04/10/2021 0.0  0.0 - 0.2 % Final   Performed at [facility], [place] [address], [place], [place] [address]   Sodium 04/10/2021 133 (A)  135 - 145 mmol/L Final   Potassium 04/10/2021 3.4 (A)  3.5 - 5.1 mmol/L Final   Chloride 04/10/2021 93 (A)  98 - 111 mmol/L Final   CO2 04/10/2021 30  22 - 32 mmol/L Final   Glucose, Bld 04/10/2021 181 (A)  70 - 99 mg/dL Final   Glucose reference range applies only to samples taken after fasting for at least 8 hours.   BUN 04/10/2021 9  8 - 23 mg/dL Final   Creatinine, Ser 04/10/2021 0.69  0.61 - 1.24 mg/dL Final   Calcium 04/10/2021 8.9  8.9 - 10.3 mg/dL Final   Total Protein 04/10/2021 8.0  6.5 - 8.1 g/dL Final   Albumin 04/10/2021 3.9  3.5 - 5.0 g/dL Final   AST 04/10/2021 32  15 - 41 U/L Final   ALT 04/10/2021 38  0 - 44 U/L Final   Alkaline Phosphatase 04/10/2021 66  38 - 126 U/L Final   Total Bilirubin 04/10/2021 1.1  0.3 - 1.2 mg/dL Final   GFR, Estimated 04/10/2021 >60  >60 mL/min Final   Comment: (NOTE) Calculated using the CKD-EPI Creatinine Equation (2021)    Anion gap 04/10/2021 10  5 - 15 Final   Performed at [facility], [place]  [address], [place], [place] [address]   ABO/RH(D) 04/10/2021 A POS   Final   Antibody Screen 04/10/2021 NEG   Final   Sample Expiration 04/10/2021 04/24/2021,2359   Final   Extend sample reason 04/10/2021    Final                   Value:NO TRANSFUSIONS OR PREGNANCY IN THE PAST 3 MONTHS Performed at [place] [address] [place], [place] [address]    Hgb A1c MFr Bld 04/10/2021 6.5 (A)  4.8 - 5.6 % Final   Comment: (NOTE) Pre diabetes:          5.7%-6.4%  Diabetes:              >6.4%  Glycemic control for   <7.0% adults with diabetes    Mean Plasma Glucose 04/10/2021 139.85  mg/dL Final   Performed at [facility], [place] [address], [place], [place] [address]   Glucose-Capillary 04/10/2021 202 (A)  70 - 99 mg/dL Final   Glucose reference range applies only to samples taken after fasting for at least 8 hours.     X-Rays:DG Chest Port 1 View  Result Date: 04/23/2021 CLINICAL DATA:  Shortness of breath. EXAM: PORTABLE CHEST 1 VIEW COMPARISON:  Chest x-ray 05/04/2011. FINDINGS: Patient is rotated. Cardiomediastinal silhouette is within normal limits for degree of rotation. The lungs and costophrenic angles are clear. There is no pneumothorax or acute fracture. IMPRESSION: No active disease. Electronically Signed   By: Ronney Asters M.D.   On: 04/23/2021 22:32   [place] EKG SITE RITE  Result Date: 04/26/2021 If Site Rite image not attached, placement could not be confirmed due to current cardiac rhythm.   EKG: Orders placed or performed during the hospital encounter of 04/10/21   EKG 12 lead per protocol   EKG 12 lead per protocol  Hospital Course: Shalik Sanfilippo is a 75 y.o. who was admitted to [facility]. They were brought to the operating room on 04/21/2021 and underwent Procedure(s): TOTAL KNEE ARTHROPLASTY.  Patient tolerated the procedure well and was later transferred to the recovery room and then to the orthopaedic floor for postoperative  care. They were given PO and IV analgesics for pain control following their surgery. They were given 24 hours of postoperative antibiotics of  Anti-infectives (From admission, onward)    Start     Dose/Rate Route Frequency Ordered Stop   04/21/21 1500  ceFAZolin (ANCEF) IVPB 2g/100 mL premix        2 g 200 mL/hr over 30 Minutes Intravenous Every 6 hours 04/21/21 1210 04/22/21 0656   04/21/21 0630  ceFAZolin (ANCEF) IVPB 2g/100 mL premix        2 g 200 mL/hr over 30 Minutes Intravenous On call to O.R. 04/21/21 [id number] 04/21/21 0932   04/21/21 0630  ceFAZolin (ANCEF) IVPB 2g/100 mL premix  Status:  Discontinued        2 g 200 mL/hr over 30 Minutes Intravenous On call to O.R. 04/21/21 2841 04/21/21 0626      and started on DVT prophylaxis in the form of Xarelto.   PT and OT were ordered for total joint protocol. Discharge planning consulted to help with postop disposition and equipment needs.  Patient had a good night on the evening of surgery. They started to get up OOB with therapy on POD #0. Pt was seen during rounds and was ready to go home pending progress with therapy.He worked with therapy on POD #1 and was meeting his goals. Pt was discharged to home later that day in stable condition.  Diet: Regular diet Activity: WBAT Follow-up: in 2 weeks Disposition: Home Discharged Condition: good   Discharge Instructions     Call MD / Call 911   Complete by: As directed    If you experience chest pain or shortness of breath, CALL 911 and be transported to the hospital emergency room.  If you develope a fever above 101 F, pus (white drainage) or increased drainage or redness at the wound, or calf pain, call your surgeon's office.   Change dressing   Complete by: As directed    Maintain surgical dressing until follow up in the clinic. If the edges start to pull up, may reinforce with tape. If the dressing is no longer working, may remove and cover with gauze and tape, but must keep the area dry  and clean.  Call with any questions or concerns.   Constipation Prevention   Complete by: As directed    Drink plenty of fluids.  Prune juice may be helpful.  You may use a stool softener, such as Colace (over the counter) 100 mg twice a day.  Use MiraLax (over the counter) for constipation as needed.   Diet - low sodium heart healthy   Complete by: As directed    Increase activity slowly as tolerated   Complete by: As directed    Weight bearing as tolerated with assist device (walker, cane, etc) as directed, use it as long as suggested by your surgeon or therapist, typically at least 4-6 weeks.   Post-operative opioid taper instructions:   Complete by: As directed    POST-OPERATIVE OPIOID TAPER INSTRUCTIONS: It is important to wean off of your opioid medication as soon as possible. If you do not need pain medication after your surgery it is ok to stop  day one. Opioids include: Codeine, Hydrocodone(Norco, Vicodin), Oxycodone(Percocet, oxycontin) and hydromorphone amongst others.  Long term and even short term use of opiods can cause: Increased pain response Dependence Constipation Depression Respiratory depression And more.  Withdrawal symptoms can include Flu like symptoms Nausea, vomiting And more Techniques to manage these symptoms Hydrate well Eat regular healthy meals Stay active Use relaxation techniques(deep breathing, meditating, yoga) Do Not substitute Alcohol to help with tapering If you have been on opioids for less than two weeks and do not have pain than it is ok to stop all together.  Plan to wean off of opioids This plan should start within one week post op of your joint replacement. Maintain the same interval or time between taking each dose and first decrease the dose.  Cut the total daily intake of opioids by one tablet each day Next start to increase the time between doses. The last dose that should be eliminated is the evening dose.      TED hose    Complete by: As directed    Use stockings (TED hose) for 2 weeks on both leg(s).  You may remove them at night for sleeping.      Allergies as of 04/22/2021       Reactions   Lisinopril Swelling   Tongue        Medication List     STOP taking these medications    ibuprofen 800 MG tablet Commonly known as: ADVIL       TAKE these medications    acetaminophen 325 MG tablet Commonly known as: TYLENOL Take 3 tablets (975 mg total) by mouth every 8 (eight) hours.   allopurinol 300 MG tablet Commonly known as: ZYLOPRIM Take 300 mg by mouth daily as needed. For gout   amLODipine 5 MG tablet Commonly known as: NORVASC Take 5 mg by mouth daily.   atorvastatin 20 MG tablet Commonly known as: LIPITOR Take 20 mg by mouth at bedtime.   cloNIDine 0.1 MG tablet Commonly known as: CATAPRES Take 0.1 mg by mouth 2 (two) times daily.   diphenhydrAMINE 50 MG tablet Commonly known as: BENADRYL Take 50 mg by mouth at bedtime as needed for sleep.   docusate sodium 100 MG capsule Commonly known as: COLACE Take 1 capsule (100 mg total) by mouth 2 (two) times daily.   DRY EYES OP Place 1 drop into both eyes daily.   indapamide 2.5 MG tablet Commonly known as: LOZOL Take 2.5 mg by mouth daily.   metFORMIN 500 MG tablet Commonly known as: GLUCOPHAGE Take 1,000 mg by mouth at bedtime.   methocarbamol 500 MG tablet Commonly known as: ROBAXIN Take 1 tablet (500 mg total) by mouth every 6 (six) hours as needed for muscle spasms.   metoprolol tartrate 50 MG tablet Commonly known as: LOPRESSOR Take 50 mg by mouth 2 (two) times daily.   oxyCODONE 5 MG immediate release tablet Commonly known as: Oxy IR/ROXICODONE Take 1-2 tablets (5-10 mg total) by mouth every 4 (four) hours as needed for severe pain.   polyethylene glycol 17 g packet Commonly known as: MIRALAX / GLYCOLAX Take 17 g by mouth daily as needed for mild constipation.               Discharge Care  Instructions  (From admission, onward)           Start     Ordered   04/22/21 0000  Change dressing       Comments: Maintain surgical  dressing until follow up in the clinic. If the edges start to pull up, may reinforce with tape. If the dressing is no longer working, may remove and cover with gauze and tape, but must keep the area dry and clean.  Call with any questions or concerns.   04/22/21 0739            Follow-up Information     Paralee Cancel, MD. Schedule an appointment as soon as possible for a visit in 2 week(s).   Specialty: Orthopedic Surgery Contact information: [address] [place] [address] [phone]                 Signed: Griffith Citron, PA-C Orthopedic Surgery 04/30/2021, 8:53 AM

## 2021-05-06 ENCOUNTER — Encounter (HOSPITAL_COMMUNITY): Payer: No Typology Code available for payment source

## 2021-05-07 ENCOUNTER — Encounter (HOSPITAL_COMMUNITY): Payer: No Typology Code available for payment source

## 2021-05-11 ENCOUNTER — Other Ambulatory Visit (HOSPITAL_COMMUNITY): Payer: Self-pay | Admitting: Student

## 2021-05-11 DIAGNOSIS — M79605 Pain in left leg: Secondary | ICD-10-CM

## 2021-05-11 DIAGNOSIS — M79661 Pain in right lower leg: Secondary | ICD-10-CM

## 2021-05-11 DIAGNOSIS — M79604 Pain in right leg: Secondary | ICD-10-CM

## 2021-05-12 ENCOUNTER — Inpatient Hospital Stay (HOSPITAL_COMMUNITY): Admission: RE | Admit: 2021-05-12 | Payer: No Typology Code available for payment source | Source: Ambulatory Visit

## 2021-05-19 ENCOUNTER — Encounter (HOSPITAL_COMMUNITY): Admission: RE | Payer: Self-pay | Source: Home / Self Care

## 2021-05-19 ENCOUNTER — Ambulatory Visit (HOSPITAL_COMMUNITY)
Admission: RE | Admit: 2021-05-19 | Payer: No Typology Code available for payment source | Source: Home / Self Care | Admitting: Orthopedic Surgery

## 2021-05-19 SURGERY — ARTHROPLASTY, KNEE, TOTAL
Anesthesia: Spinal | Site: Knee | Laterality: Left

## 2021-06-03 DIAGNOSIS — K259 Gastric ulcer, unspecified as acute or chronic, without hemorrhage or perforation: Secondary | ICD-10-CM | POA: Diagnosis not present

## 2022-02-12 DIAGNOSIS — R509 Fever, unspecified: Secondary | ICD-10-CM | POA: Diagnosis not present

## 2022-02-12 DIAGNOSIS — J069 Acute upper respiratory infection, unspecified: Secondary | ICD-10-CM | POA: Diagnosis not present

## 2022-02-12 DIAGNOSIS — Z20822 Contact with and (suspected) exposure to covid-19: Secondary | ICD-10-CM | POA: Diagnosis not present

## 2023-04-28 ENCOUNTER — Encounter (HOSPITAL_COMMUNITY): Payer: Self-pay | Admitting: Orthopedic Surgery

## 2023-05-08 IMAGING — DX DG CHEST 1V PORT
1 series · 1 of 1 positions shown · non-contrast
Comparison: Chest x-ray 05/04/2011.

CLINICAL DATA: Shortness of breath.

EXAM:
PORTABLE CHEST 1 VIEW

[chest ap]
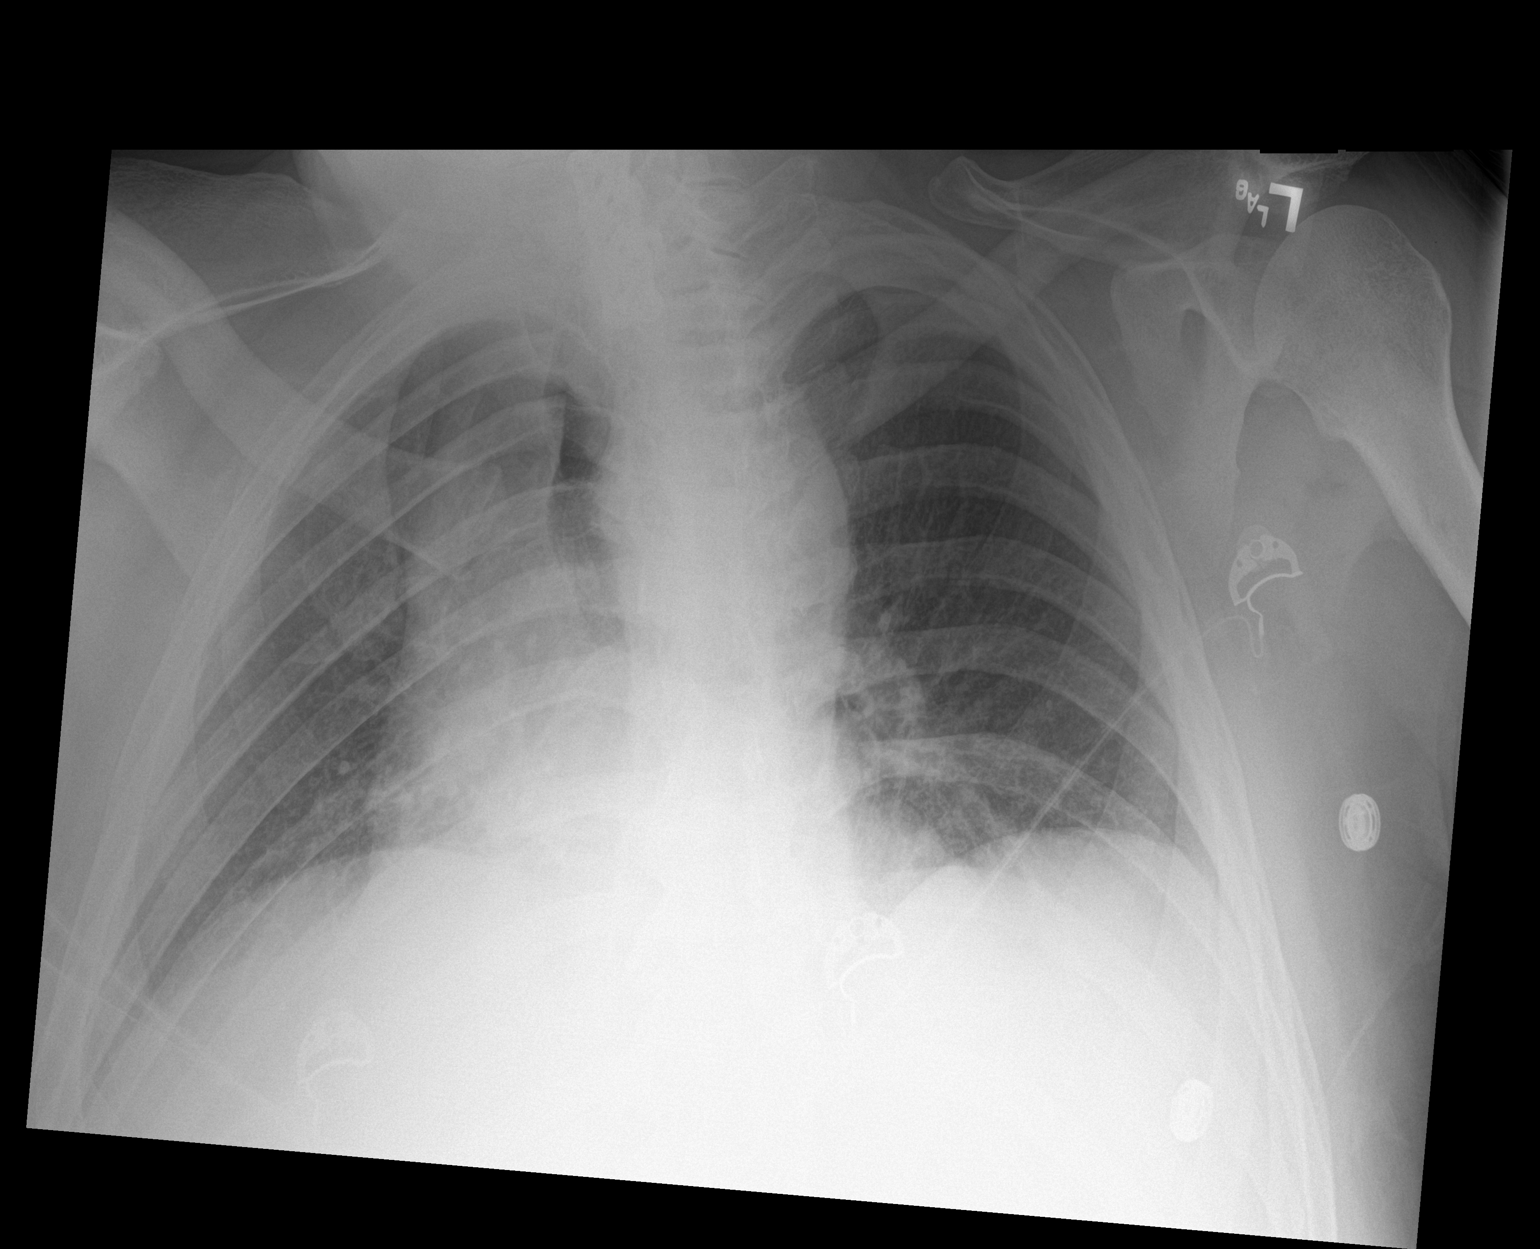

[1 of 1 positions shown; findings below may reference images not displayed]

FINDINGS: Patient is rotated. Cardiomediastinal silhouette is within normal
limits for degree of rotation. The lungs and costophrenic angles are
clear. There is no pneumothorax or acute fracture.
IMPRESSION: No active disease.
